# Patient Record
Sex: Male | Born: 1995 | Race: White | Hispanic: No | Marital: Single | Smoking: Current some day smoker
Health system: Southern US, Community
[De-identification: ages and names within clinical notes are randomized; demographics above are authoritative.]

## PROBLEM LIST (undated history)

## (undated) DIAGNOSIS — C801 Malignant (primary) neoplasm, unspecified: Secondary | ICD-10-CM

## (undated) HISTORY — PX: KNEE SURGERY: SHX244

## (undated) HISTORY — PX: MEDIPORT INSERTION, SINGLE: SHX2027

## (undated) HISTORY — PX: FEMUR TUMOR RESECTION: SHX944

## (undated) HISTORY — PX: MEDIPORT REMOVAL: SHX2028

---
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
## 2016-06-26 NOTE — Procedures (Signed)
 [facility] Radiology Procedure Note     Operators (Resident/Attending): Cydne Jodee Monte   Procedure: Fluoroscopic guided right hip injection   Indication: status post right hip arthroplasty with pain    Findings: Contrast outlining the right hip joint.    Estimated blood loss (if applicable): None   Specimens removed (if applicable): None   Postoperative diagnosis : Same   Cydne Aneita Holt, MD   Electronically signed by: Cydne Aneita Holt, MD Resident 06/26/16 [phone]

## 2016-07-22 NOTE — Progress Notes (Signed)
--  Fifteen minutes were spent during this established patient encounter.  The encounter diagnosis was Ewing's sarcoma of right femur (HCC).  --More than half of that time was spent in a face to face manner in the examination room, coordinating the patient's orthopedic care, and counseling and educating the patient. States injection very helpful in reducing R hip joint pain  --Together we reviewed the pertinent physical findings on today's exam: walks with less limp  --Based on this information I communicated my impression that his pain is from articular wear of the acetabulum  --Together we agreed to proceed with repeat shot as needed for now.  Will call to get script for new shot at [facility] as needed.  --f/u surveillance films in December   Electronically signed by: Glendia Ambrose Blush, MD 07/22/16 [phone]

## 2016-08-05 NOTE — Telephone Encounter (Signed)
 Would like to reschedule appointment on 08/26/16. He will be home from college from 07/3016/-08/15/16. Please advise.

## 2016-08-06 NOTE — Telephone Encounter (Signed)
 Called and spoke to mom. Echo appointment and ONC appointment moved to 08/15/16. She will check with patient and will call us  back if theres any other change. Agreed on having ECHO first, X-rays and then ONC. Mom okay with plan.

## 2016-09-20 ENCOUNTER — Ambulatory Visit (HOSPITAL_COMMUNITY)
Admission: RE | Admit: 2016-09-20 | Discharge: 2016-09-20 | Disposition: A | Discharge status: Home / Self Care | Payer: BLUE CROSS/BLUE SHIELD | Source: Ambulatory Visit | Attending: Pediatrics | Admitting: Pediatrics

## 2016-09-20 ENCOUNTER — Ambulatory Visit (HOSPITAL_COMMUNITY)
Admit: 2016-09-20 | Discharge: 2016-09-20 | Disposition: A | Discharge status: Home / Self Care | Payer: BLUE CROSS/BLUE SHIELD | Source: Other Acute Inpatient Hospital | Attending: Pediatrics | Admitting: Pediatrics

## 2016-09-20 ENCOUNTER — Other Ambulatory Visit (HOSPITAL_COMMUNITY): Payer: Self-pay | Admitting: Pediatrics

## 2016-09-20 DIAGNOSIS — N5089 Other specified disorders of the male genital organs: Secondary | ICD-10-CM | POA: Insufficient documentation

## 2016-09-20 DIAGNOSIS — N50811 Right testicular pain: Secondary | ICD-10-CM | POA: Insufficient documentation
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
## 2017-05-19 DIAGNOSIS — F419 Anxiety disorder, unspecified: Secondary | ICD-10-CM | POA: Insufficient documentation

## 2017-10-12 ENCOUNTER — Ambulatory Visit: Payer: BLUE CROSS/BLUE SHIELD | Admitting: Podiatry

## 2017-10-12 ENCOUNTER — Encounter: Payer: Self-pay | Admitting: Podiatry

## 2017-10-12 VITALS — BP 103/71 | HR 70

## 2017-10-12 DIAGNOSIS — M79671 Pain in right foot: Secondary | ICD-10-CM

## 2017-10-12 DIAGNOSIS — M79672 Pain in left foot: Secondary | ICD-10-CM

## 2017-10-12 DIAGNOSIS — B070 Plantar wart: Secondary | ICD-10-CM | POA: Diagnosis not present

## 2017-10-12 NOTE — Progress Notes (Signed)
Subjective:   Patient ID: Edwin Cook, male   DOB: 22 y.o.   MRN: [id number]   HPI Patient presents with mother with multiple lesions bottom of the right foot which is been present for several years.  They have tried over-the-counter medicine and trimming without relief and states that they are increasingly sore with walking.  Patient does not smoke and would like to be active and is currently in college and returning to college in mid August   Review of Systems  All other systems reviewed and are negative.       Objective:  Physical Exam  Constitutional: He appears well-developed and well-nourished.  Cardiovascular: Intact distal pulses.  Pulmonary/Chest: Effort normal.  Musculoskeletal: Normal range of motion.  Neurological: He is alert.  Skin: Skin is warm.  Nursing note and vitals reviewed.   Neurovascular status found to be intact muscle strength is adequate range of motion within normal limits.  Patient is noted to have multiple patches of keratotic lesion plantar aspect right first metatarsal and right plantar hallux.  Upon debridement there is pinpoint bleeding noted within all lesion in the painful to lateral pressure.  Patient is found to have good digital perfusion well oriented x3     Assessment:  Verruca plantaris plantar aspect right first metatarsal right hallux     Plan:  H&P conditions reviewed and recommended utilizing immune agent to try to kill the viral tissue.  Today I debrided all lesions I then applied substance to create an inflammatory and immune response and applied sterile dressings.  Gave instructions on what to do if any blistering were to occur and dispense padding if any pain or to occur.  Also placed on topical medicine with 5-fluorouracil and patient will be seen back in 3 weeks or earlier if needed

## 2017-10-22 DIAGNOSIS — F900 Attention-deficit hyperactivity disorder, predominantly inattentive type: Secondary | ICD-10-CM | POA: Insufficient documentation

## 2017-10-26 ENCOUNTER — Encounter: Payer: Self-pay | Admitting: Podiatry

## 2017-10-26 ENCOUNTER — Ambulatory Visit (INDEPENDENT_AMBULATORY_CARE_PROVIDER_SITE_OTHER): Payer: BLUE CROSS/BLUE SHIELD | Admitting: Podiatry

## 2017-10-26 DIAGNOSIS — B070 Plantar wart: Secondary | ICD-10-CM | POA: Diagnosis not present

## 2017-10-26 NOTE — Progress Notes (Signed)
Subjective:   Patient ID: Edwin Cook, male   DOB: 22 y.o.   MRN: [id number]   HPI Patient states the lesion is still present and well is improved and I did get small blistering it still is there   ROS      Objective:  Physical Exam  Neurovascular status intact with patient's right lesion upon debridement shows pinpoint bleeding with pain to lateral pressure     Assessment:  Verruca plantaris right foot plantar measuring approximately 1 cm x 1 cm     Plan:  Sterile debridement accomplished and applied chemical agent to create immune response with sterile dressing.  Reappoint as symptoms persist or indicate if there is any enlargement or other pathology

## 2018-05-24 ENCOUNTER — Ambulatory Visit (INDEPENDENT_AMBULATORY_CARE_PROVIDER_SITE_OTHER): Payer: BLUE CROSS/BLUE SHIELD | Admitting: Podiatry

## 2018-05-24 ENCOUNTER — Encounter: Payer: Self-pay | Admitting: Podiatry

## 2018-05-24 DIAGNOSIS — B070 Plantar wart: Secondary | ICD-10-CM | POA: Diagnosis not present

## 2018-05-24 NOTE — Progress Notes (Signed)
.  NRSTANDARDNOTE Subjective:   Patient ID: Edwin Cook, male   DOB: 23 y.o.   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  I do think orthotics would be best for him and I reviewed that with him and his mother and they are to schedule with ped orthotist for orthotic casting and measurement to decide the appropriate amount of lift of the right side.  Today I debrided the lesion on the right exposed the tissue and applied chemical consisting of immune agent and sterile dressings.  Reappoint for [place] to recheck

## 2018-06-21 ENCOUNTER — Ambulatory Visit: Payer: Self-pay | Admitting: Podiatry

## 2018-06-21 ENCOUNTER — Other Ambulatory Visit: Payer: Self-pay | Admitting: Orthotics

## 2018-06-30 ENCOUNTER — Other Ambulatory Visit: Payer: Self-pay

## 2018-06-30 ENCOUNTER — Encounter: Payer: Self-pay | Admitting: Podiatry

## 2018-06-30 ENCOUNTER — Ambulatory Visit: Payer: BLUE CROSS/BLUE SHIELD | Admitting: Podiatry

## 2018-06-30 ENCOUNTER — Other Ambulatory Visit: Payer: BLUE CROSS/BLUE SHIELD | Admitting: Orthotics

## 2018-06-30 VITALS — Temp 97.3°F

## 2018-06-30 DIAGNOSIS — B070 Plantar wart: Secondary | ICD-10-CM | POA: Diagnosis not present

## 2018-06-30 DIAGNOSIS — M7751 Other enthesopathy of right foot: Secondary | ICD-10-CM

## 2018-06-30 DIAGNOSIS — M779 Enthesopathy, unspecified: Secondary | ICD-10-CM

## 2018-06-30 DIAGNOSIS — M7752 Other enthesopathy of left foot: Secondary | ICD-10-CM | POA: Diagnosis not present

## 2018-07-01 NOTE — Progress Notes (Signed)
Subjective:   Patient ID: Edwin Cook, male   DOB: 23 y.o.   MRN: [id number]   HPI Patient presents stating he has lesions on the bottom of my right are improving but still present and I do have a significant limb length with chronic pain in my feet and I need orthotics   ROS      Objective:  Physical Exam  Neurovascular status intact with patient found to have multiple lesions plantar aspect right first metatarsal hallux that upon debridement she will still pinpoint bleeding but are thinner than previous with significant limb length discrepancy with right leg being approximate 1/2 inch shorter than left secondary to previous bone cancer     Assessment:  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  Instructed on what to do blistering were to occur and also went ahead today and casted for functional orthotic devices with heel lift right to support the plantar arch

## 2018-07-29 ENCOUNTER — Ambulatory Visit: Payer: BLUE CROSS/BLUE SHIELD | Admitting: Orthotics

## 2018-07-29 ENCOUNTER — Encounter: Payer: Self-pay | Admitting: Podiatry

## 2018-07-29 ENCOUNTER — Ambulatory Visit: Payer: BLUE CROSS/BLUE SHIELD | Admitting: Podiatry

## 2018-07-29 ENCOUNTER — Other Ambulatory Visit: Payer: Self-pay

## 2018-07-29 VITALS — Temp 98.4°F

## 2018-07-29 DIAGNOSIS — B070 Plantar wart: Secondary | ICD-10-CM

## 2018-07-29 DIAGNOSIS — M79671 Pain in right foot: Secondary | ICD-10-CM

## 2018-07-29 DIAGNOSIS — M779 Enthesopathy, unspecified: Secondary | ICD-10-CM

## 2018-07-29 NOTE — Progress Notes (Signed)
Subjective:   Patient ID: Edwin Cook, male   DOB: 23 y.o.   MRN: [id number]   HPI Patient presents stating the orthotics feel well and is here for chronic plantar wart right stating it is doing much better   ROS      Objective:  Physical Exam  Neurovascular status intact with multiple keratotic lesion sub-first metatarsal head right hallux that have improved with several of them showing pinpoint bleeding but significant reduction from previous     Assessment:  Verruca plantaris right improving     Plan:  Sterile sharp debridement accomplished and I applied agent to create immune response with sterile dressing and patient is discharged and will be seen back as needed

## 2018-07-29 NOTE — Patient Instructions (Signed)

## 2018-07-29 NOTE — Progress Notes (Signed)
Patient came in today to pick up custom made foot orthotics.  The goals were accomplished and the patient reported no dissatisfaction with said orthotics.  Patient was advised of breakin period and how to report any issues. 

## 2019-04-07 ENCOUNTER — Other Ambulatory Visit: Payer: Self-pay

## 2019-04-07 ENCOUNTER — Encounter (HOSPITAL_COMMUNITY): Payer: Self-pay | Admitting: Emergency Medicine

## 2019-04-07 ENCOUNTER — Emergency Department (HOSPITAL_COMMUNITY)
Admission: EM | Admit: 2019-04-07 | Discharge: 2019-04-07 | Disposition: A | Discharge status: Home / Self Care | Payer: BC Managed Care – PPO | Attending: Emergency Medicine | Admitting: Emergency Medicine

## 2019-04-07 ENCOUNTER — Emergency Department (HOSPITAL_COMMUNITY): Payer: BC Managed Care – PPO

## 2019-04-07 DIAGNOSIS — M545 Low back pain: Secondary | ICD-10-CM | POA: Insufficient documentation

## 2019-04-07 DIAGNOSIS — Z79899 Other long term (current) drug therapy: Secondary | ICD-10-CM | POA: Diagnosis not present

## 2019-04-07 DIAGNOSIS — Z96641 Presence of right artificial hip joint: Secondary | ICD-10-CM | POA: Diagnosis not present

## 2019-04-07 DIAGNOSIS — R0789 Other chest pain: Secondary | ICD-10-CM | POA: Diagnosis present

## 2019-04-07 DIAGNOSIS — Z85830 Personal history of malignant neoplasm of bone: Secondary | ICD-10-CM | POA: Diagnosis not present

## 2019-04-07 LAB — CBC
HCT: 40.5 % (ref 39.0–52.0)
Hemoglobin: 13.5 g/dL (ref 13.0–17.0)
MCH: 30.5 pg (ref 26.0–34.0)
MCHC: 33.3 g/dL (ref 30.0–36.0)
MCV: 91.4 fL (ref 80.0–100.0)
Platelets: 364 10*3/uL (ref 150–400)
RBC: 4.43 MIL/uL (ref 4.22–5.81)
RDW: 12.2 % (ref 11.5–15.5)
WBC: 13.4 10*3/uL — ABNORMAL HIGH (ref 4.0–10.5)
nRBC: 0 % (ref 0.0–0.2)

## 2019-04-07 LAB — TROPONIN I (HIGH SENSITIVITY)
Troponin I (High Sensitivity): 3 ng/L (ref <18)
Troponin I (High Sensitivity): <2 ng/L (ref <18)

## 2019-04-07 LAB — PROTIME-INR
INR: 1.1 (ref 0.8–1.2)
Prothrombin Time: 13.8 seconds (ref 11.4–15.2)

## 2019-04-07 LAB — BASIC METABOLIC PANEL
Anion gap: 10 (ref 5–15)
BUN: 9 mg/dL (ref 6–20)
CO2: 27 mmol/L (ref 22–32)
Calcium: 9.1 mg/dL (ref 8.9–10.3)
Chloride: 101 mmol/L (ref 98–111)
Creatinine, Ser: 0.85 mg/dL (ref 0.61–1.24)
GFR calc Af Amer: >60 mL/min (ref >60)
GFR calc non Af Amer: >60 mL/min (ref >60)
Glucose, Bld: 102 mg/dL — ABNORMAL HIGH (ref 70–99)
Potassium: 3.6 mmol/L (ref 3.5–5.1)
Sodium: 138 mmol/L (ref 135–145)

## 2019-04-07 LAB — D-DIMER, QUANTITATIVE (NOT AT ARMC): D-Dimer, Quant: 0.31 ug/mL-FEU (ref 0.00–0.50)

## 2019-04-07 MED ORDER — IBUPROFEN 800 MG PO TABS: 800.0000 mg | ORAL_TABLET | Freq: Four times a day (QID) | ORAL | 0 refills | Status: DC | PRN

## 2019-04-07 MED ORDER — SODIUM CHLORIDE 0.9% FLUSH: 3.0000 mL | Freq: Once | INTRAVENOUS | Status: DC

## 2019-04-07 MED ORDER — HYDROCODONE-ACETAMINOPHEN 5-325 MG PO TABS
1.0000 | ORAL_TABLET | Freq: Once | ORAL | Status: AC
  Administered 2019-04-07: 06:00:00 1 via ORAL
  Filled 2019-04-07: qty 1

## 2019-04-07 MED ORDER — IBUPROFEN 800 MG PO TABS
800.0000 mg | ORAL_TABLET | Freq: Once | ORAL | Status: AC
  Administered 2019-04-07: 800 mg via ORAL
  Filled 2019-04-07: qty 1

## 2019-04-07 NOTE — ED Provider Notes (Addendum)
[facility] EMERGENCY DEPARTMENT Provider Note   CSN: [id number] Arrival date & time: 04/07/19  0117     History Chief Complaint  Patient presents with  . Chest Pain    Edwin Cook is a 24 y.o. male.  Patient presents to the emergency department for evaluation of chest pain.  Patient reports onset of a sharp pain to the left of his sternum earlier today.  Pain radiates into the back and towards the left shoulder.  He reports that he feels like his pain worsens when he takes a deep breath.  He has not had any cough, congestion or flulike symptoms.  Patient denies any previous lung disease.  No history of blood clots, no recent surgery, travel.  He denies leg pain and swelling.  Patient does report that 2 or 3 days ago he bent over while sitting in a chair and had sudden onset of pain in the lower back.  At that time the pain worsens when he took breaths as well.  Pain in the lower back has resolved.        Past Medical History:  Diagnosis Date  . 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 ADHD (attention deficit hyperactivity disorder), inattentive type 10/22/2017  . Anxiety 05/19/2017  . 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 Metatarsal stress fracture of right foot with delayed healing 05/09/2014  . Major depressive disorder with single episode, in partial remission ([place]) 07/16/2013  . Pain, joint, knee, left 05/11/2012  . Hip pain, right 01/27/2012  . Major depressive disorder 01/13/2012  . Obesity 07/25/2011  . Leg length difference, acquired 07/17/2011  . Fatigue 02/11/2011  . Intermittent fever of unknown origin 02/11/2011  . Status post right hip replacement 01/07/2011    Past Surgical History:  Procedure Laterality Date  . FEMUR TUMOR RESECTION    . KNEE SURGERY    . KNEE SURGERY    . MEDIPORT INSERTION, SINGLE    . MEDIPORT REMOVAL         Family History  Problem Relation Age of Onset    . Diabetes Mother   . Hypertension Mother   . Thyroid disease Mother   . Asthma Father     Social History   Tobacco Use  . Smoking status: Never Smoker  . Smokeless tobacco: Never Used  Substance Use Topics  . Alcohol use: No    Alcohol/week: 0.0 standard drinks  . Drug use: No    Home Medications Prior to Admission medications   Medication Sig Start Date End Date Taking? Authorizing Provider  buPROPion (WELLBUTRIN XL) 300 MG 24 hr tablet Take 300 mg by mouth daily. 09/05/17   [provider]  busPIRone (BUSPAR) 10 MG tablet Take by mouth. 05/24/18   [provider]  ibuprofen (ADVIL) 800 MG tablet Take 1 tablet (800 mg total) by mouth every 6 (six) hours as needed for moderate pain. 04/07/19   Orpah Greek, MD  methylphenidate (RITALIN) 10 MG tablet Take 10 mg by mouth 2 (two) times daily. 07/22/17   [provider]  NON FORMULARY [facility] Wart Cream Cimetidine 2%; Deoxy-D-Glucose 0.2%; Fluorouracil 5%; Salicylic Acid 0000000 Faxed over on 05/24/18 with refills as needed    [provider]  propranolol (INDERAL) 10 MG tablet Take by mouth. 05/24/18   [provider]    Allergies    Morphine and related  Review of Systems  Review of Systems  Cardiovascular: Positive for chest pain.  Musculoskeletal: Positive for back pain.  All other systems reviewed and are negative.   Physical Exam Updated Vital Signs BP 116/65 (BP Location: Left Arm)   Pulse 85   Temp 98.1 F (36.7 C) (Oral)   Resp (!) 23   SpO2 97%   Physical Exam Vitals and nursing note reviewed.  Constitutional:      General: He is not in acute distress.    Appearance: Normal appearance. He is well-developed.  HENT:     Head: Normocephalic and atraumatic.     Right Ear: Hearing normal.     Left Ear: Hearing normal.     Nose: Nose normal.  Eyes:     Conjunctiva/sclera: Conjunctivae normal.     Pupils: Pupils are equal, round, and reactive to  light.  Cardiovascular:     Rate and Rhythm: Regular rhythm. Tachycardia present.     Heart sounds: S1 normal and S2 normal. No murmur. No friction rub. No gallop.   Pulmonary:     Effort: Pulmonary effort is normal. No respiratory distress.     Breath sounds: Normal breath sounds.  Chest:     Chest wall: No tenderness.  Abdominal:     General: Bowel sounds are normal.     Palpations: Abdomen is soft.     Tenderness: There is no abdominal tenderness. There is no guarding or rebound. Negative signs include Murphy's sign and McBurney's sign.     Hernia: No hernia is present.  Musculoskeletal:        General: Normal range of motion.     Cervical back: Normal range of motion and neck supple.  Skin:    General: Skin is warm and dry.     Findings: No rash.  Neurological:     Mental Status: He is alert and oriented to person, place, and time.     GCS: GCS eye subscore is 4. GCS verbal subscore is 5. GCS motor subscore is 6.     Cranial Nerves: No cranial nerve deficit.     Sensory: No sensory deficit.     Coordination: Coordination normal.  Psychiatric:        Speech: Speech normal.        Behavior: Behavior normal.        Thought Content: Thought content normal.     ED Results / Procedures / Treatments   Labs (all labs ordered are listed, but only abnormal results are displayed) Labs Reviewed  BASIC METABOLIC PANEL - Abnormal; Notable for the following components:      Result Value   Glucose, Bld 102 (*)    All other components within normal limits  CBC - Abnormal; Notable for the following components:   WBC 13.4 (*)    All other components within normal limits  PROTIME-INR  D-DIMER, QUANTITATIVE (NOT AT [facility])  TROPONIN I (HIGH SENSITIVITY)  TROPONIN I (HIGH SENSITIVITY)    EKG EKG Interpretation  Date/Time:  Thursday April 07 2019 01:21:39 EST Ventricular Rate:  109 PR Interval:  136 QRS Duration: 88 QT Interval:  306 QTC Calculation: 412 R Axis:   77 Text  Interpretation: Sinus tachycardia Nonspecific T wave abnormality Abnormal ECG No STEMI Confirmed by Nanda Quinton [id number]) on 04/21/2019 7:37:18 PM   Radiology No results found.  Procedures Procedures (including critical care time)  Medications Ordered in ED Medications  HYDROcodone-acetaminophen (NORCO/VICODIN) 5-325 MG per tablet 1 tablet (1 tablet Oral Given 04/07/19 0601)  ibuprofen (  ADVIL) tablet 800 mg (800 mg Oral Given 04/07/19 [id number])    ED Course  I have reviewed the triage vital signs and the nursing notes.  Pertinent labs & imaging results that were available during my care of the patient were reviewed by me and considered in my medical decision making (see chart for details).    MDM Rules/Calculators/A&P                      Patient presents to the emergency department for evaluation of chest pain.  Patient complaining of pain in the left chest, just next to the sternum that radiates into his back into the left shoulder.  Left shoulder pain worsens with movement of the shoulder but I cannot reproduce the chest pain with palpation.  He does notice that it seems to worsen when he takes a deep breath.  It worsens when he lays down, improves if he sits up.  Cardiac evaluation unremarkable.  Troponins negative x2.  EKG unremarkable, no sign of pericarditis.  He did have mild tachycardia, PE considered part of the differential but he does not have any risk factors.  D-dimer normal.  Tachycardia felt to be secondary to pain.  Treated with analgesia. Final Clinical Impression(s) / ED Diagnoses Final diagnoses:  Chest wall pain    Rx / DC Orders ED Discharge Orders         Ordered    ibuprofen (ADVIL) 800 MG tablet  Every 6 hours PRN     04/07/19 0625           Orpah Greek, MD 04/07/19 [id number]    Orpah Greek, MD 04/28/19 [phone]

## 2019-04-07 NOTE — ED Triage Notes (Signed)
Patient reports mid sternal chest pain with SOB onset this evening pain radiating to left shoulder and left lower back , pain increases with deep inspiration , denies cough or fever , no emesis or diaphoresis .

## 2019-08-12 ENCOUNTER — Other Ambulatory Visit: Payer: Self-pay

## 2019-08-12 ENCOUNTER — Ambulatory Visit (INDEPENDENT_AMBULATORY_CARE_PROVIDER_SITE_OTHER): Payer: BC Managed Care – PPO

## 2019-08-12 ENCOUNTER — Encounter (HOSPITAL_COMMUNITY): Payer: Self-pay

## 2019-08-12 ENCOUNTER — Emergency Department (HOSPITAL_COMMUNITY): Payer: BC Managed Care – PPO

## 2019-08-12 ENCOUNTER — Inpatient Hospital Stay (HOSPITAL_COMMUNITY): Payer: BC Managed Care – PPO

## 2019-08-12 ENCOUNTER — Inpatient Hospital Stay (HOSPITAL_COMMUNITY)
Admission: EM | Admit: 2019-08-12 | Discharge: 2019-08-13 | DRG: 871 | Disposition: A | Discharge status: Other Acute Inpatient Hospital | Payer: BC Managed Care – PPO | Attending: Internal Medicine | Admitting: Internal Medicine

## 2019-08-12 ENCOUNTER — Ambulatory Visit (INDEPENDENT_AMBULATORY_CARE_PROVIDER_SITE_OTHER)
Admission: EM | Admit: 2019-08-12 | Discharge: 2019-08-12 | Disposition: A | Discharge status: Other Acute Inpatient Hospital | Payer: BC Managed Care – PPO | Source: Home / Self Care

## 2019-08-12 ENCOUNTER — Encounter (HOSPITAL_COMMUNITY): Payer: Self-pay | Admitting: Emergency Medicine

## 2019-08-12 DIAGNOSIS — Z20822 Contact with and (suspected) exposure to covid-19: Secondary | ICD-10-CM | POA: Diagnosis present

## 2019-08-12 DIAGNOSIS — Z9889 Other specified postprocedural states: Secondary | ICD-10-CM

## 2019-08-12 DIAGNOSIS — A419 Sepsis, unspecified organism: Secondary | ICD-10-CM | POA: Diagnosis present

## 2019-08-12 DIAGNOSIS — J9601 Acute respiratory failure with hypoxia: Secondary | ICD-10-CM | POA: Diagnosis present

## 2019-08-12 DIAGNOSIS — F17290 Nicotine dependence, other tobacco product, uncomplicated: Secondary | ICD-10-CM | POA: Diagnosis present

## 2019-08-12 DIAGNOSIS — R0602 Shortness of breath: Secondary | ICD-10-CM

## 2019-08-12 DIAGNOSIS — Z885 Allergy status to narcotic agent status: Secondary | ICD-10-CM

## 2019-08-12 DIAGNOSIS — F909 Attention-deficit hyperactivity disorder, unspecified type: Secondary | ICD-10-CM | POA: Diagnosis present

## 2019-08-12 DIAGNOSIS — J90 Pleural effusion, not elsewhere classified: Secondary | ICD-10-CM | POA: Diagnosis present

## 2019-08-12 DIAGNOSIS — E669 Obesity, unspecified: Secondary | ICD-10-CM | POA: Diagnosis present

## 2019-08-12 DIAGNOSIS — C419 Malignant neoplasm of bone and articular cartilage, unspecified: Secondary | ICD-10-CM | POA: Diagnosis present

## 2019-08-12 DIAGNOSIS — J189 Pneumonia, unspecified organism: Secondary | ICD-10-CM | POA: Diagnosis present

## 2019-08-12 DIAGNOSIS — R079 Chest pain, unspecified: Secondary | ICD-10-CM | POA: Diagnosis not present

## 2019-08-12 DIAGNOSIS — Z79899 Other long term (current) drug therapy: Secondary | ICD-10-CM | POA: Diagnosis not present

## 2019-08-12 DIAGNOSIS — Z538 Procedure and treatment not carried out for other reasons: Secondary | ICD-10-CM | POA: Diagnosis present

## 2019-08-12 DIAGNOSIS — Z6833 Body mass index (BMI) 33.0-33.9, adult: Secondary | ICD-10-CM

## 2019-08-12 DIAGNOSIS — Z96641 Presence of right artificial hip joint: Secondary | ICD-10-CM | POA: Diagnosis present

## 2019-08-12 DIAGNOSIS — Z9221 Personal history of antineoplastic chemotherapy: Secondary | ICD-10-CM

## 2019-08-12 DIAGNOSIS — J869 Pyothorax without fistula: Secondary | ICD-10-CM | POA: Diagnosis present

## 2019-08-12 LAB — HIV ANTIBODY (ROUTINE TESTING W REFLEX): HIV Screen 4th Generation wRfx: NONREACTIVE

## 2019-08-12 LAB — CBC WITH DIFFERENTIAL/PLATELET
Abs Immature Granulocytes: 0.09 10*3/uL — ABNORMAL HIGH (ref 0.00–0.07)
Basophils Absolute: 0.1 10*3/uL (ref 0.0–0.1)
Basophils Relative: 0 %
Eosinophils Absolute: 0 10*3/uL (ref 0.0–0.5)
Eosinophils Relative: 0 %
HCT: 39.2 % (ref 39.0–52.0)
Hemoglobin: 13.2 g/dL (ref 13.0–17.0)
Immature Granulocytes: 1 %
Lymphocytes Relative: 10 %
Lymphs Abs: 1.6 10*3/uL (ref 0.7–4.0)
MCH: 30.3 pg (ref 26.0–34.0)
MCHC: 33.7 g/dL (ref 30.0–36.0)
MCV: 90.1 fL (ref 80.0–100.0)
Monocytes Absolute: 1.8 10*3/uL — ABNORMAL HIGH (ref 0.1–1.0)
Monocytes Relative: 11 %
Neutro Abs: 12 10*3/uL — ABNORMAL HIGH (ref 1.7–7.7)
Neutrophils Relative %: 78 %
Platelets: 284 10*3/uL (ref 150–400)
RBC: 4.35 MIL/uL (ref 4.22–5.81)
RDW: 12.3 % (ref 11.5–15.5)
WBC: 15.5 10*3/uL — ABNORMAL HIGH (ref 4.0–10.5)
nRBC: 0 % (ref 0.0–0.2)

## 2019-08-12 LAB — COMPREHENSIVE METABOLIC PANEL
ALT: 18 U/L (ref 0–44)
AST: 14 U/L — ABNORMAL LOW (ref 15–41)
Albumin: 3.6 g/dL (ref 3.5–5.0)
Alkaline Phosphatase: 85 U/L (ref 38–126)
Anion gap: 12 (ref 5–15)
BUN: 10 mg/dL (ref 6–20)
CO2: 20 mmol/L — ABNORMAL LOW (ref 22–32)
Calcium: 8.9 mg/dL (ref 8.9–10.3)
Chloride: 102 mmol/L (ref 98–111)
Creatinine, Ser: 0.74 mg/dL (ref 0.61–1.24)
GFR calc Af Amer: >60 mL/min (ref >60)
GFR calc non Af Amer: >60 mL/min (ref >60)
Glucose, Bld: 93 mg/dL (ref 70–99)
Potassium: 3.9 mmol/L (ref 3.5–5.1)
Sodium: 134 mmol/L — ABNORMAL LOW (ref 135–145)
Total Bilirubin: 1.1 mg/dL (ref 0.3–1.2)
Total Protein: 7.4 g/dL (ref 6.5–8.1)

## 2019-08-12 LAB — URINALYSIS, ROUTINE W REFLEX MICROSCOPIC
Bacteria, UA: NONE SEEN
Bilirubin Urine: NEGATIVE
Glucose, UA: NEGATIVE mg/dL
Ketones, ur: NEGATIVE mg/dL
Leukocytes,Ua: NEGATIVE
Nitrite: NEGATIVE
Protein, ur: 30 mg/dL — AB
Specific Gravity, Urine: 1.021 (ref 1.005–1.030)
pH: 5 (ref 5.0–8.0)

## 2019-08-12 LAB — RAPID URINE DRUG SCREEN, HOSP PERFORMED
Amphetamines: NOT DETECTED
Barbiturates: NOT DETECTED
Benzodiazepines: NOT DETECTED
Cocaine: NOT DETECTED
Opiates: NOT DETECTED
Tetrahydrocannabinol: NOT DETECTED

## 2019-08-12 LAB — LACTIC ACID, PLASMA: Lactic Acid, Venous: 1 mmol/L (ref 0.5–1.9)

## 2019-08-12 LAB — C-REACTIVE PROTEIN: CRP: 23.4 mg/dL — ABNORMAL HIGH (ref <1.0)

## 2019-08-12 LAB — PROCALCITONIN: Procalcitonin: 1.37 ng/mL

## 2019-08-12 LAB — SEDIMENTATION RATE: Sed Rate: 68 mm/hr — ABNORMAL HIGH (ref 0–16)

## 2019-08-12 LAB — LACTATE DEHYDROGENASE: LDH: 171 U/L (ref 98–192)

## 2019-08-12 LAB — SARS CORONAVIRUS 2 BY RT PCR (HOSPITAL ORDER, PERFORMED IN ~~LOC~~ HOSPITAL LAB): SARS Coronavirus 2: NEGATIVE

## 2019-08-12 LAB — PROTIME-INR
INR: 1.2 (ref 0.8–1.2)
Prothrombin Time: 15 seconds (ref 11.4–15.2)

## 2019-08-12 LAB — APTT: aPTT: 35 seconds (ref 24–36)

## 2019-08-12 MED ORDER — ACETAMINOPHEN 650 MG RE SUPP: 650.0000 mg | Freq: Four times a day (QID) | RECTAL | Status: DC | PRN

## 2019-08-12 MED ORDER — SODIUM CHLORIDE 0.9 % IV SOLN
2.0000 g | INTRAVENOUS | Status: DC
  Administered 2019-08-12 – 2019-08-13 (×2): 2 g via INTRAVENOUS
  Filled 2019-08-12: qty 20
  Filled 2019-08-12: qty 2

## 2019-08-12 MED ORDER — HYDROCODONE-ACETAMINOPHEN 5-325 MG PO TABS
1.0000 | ORAL_TABLET | ORAL | Status: DC | PRN
  Administered 2019-08-12: 2 via ORAL
  Administered 2019-08-13: 1 via ORAL
  Administered 2019-08-13: 2 via ORAL
  Filled 2019-08-12: qty 1
  Filled 2019-08-12 (×2): qty 2

## 2019-08-12 MED ORDER — ENOXAPARIN SODIUM 40 MG/0.4ML ~~LOC~~ SOLN
40.0000 mg | SUBCUTANEOUS | Status: DC
  Filled 2019-08-12: qty 0.4

## 2019-08-12 MED ORDER — SODIUM CHLORIDE 0.9 % IV BOLUS (SEPSIS): 500.0000 mL | Freq: Once | INTRAVENOUS | Status: DC

## 2019-08-12 MED ORDER — SODIUM CHLORIDE 0.9% FLUSH
3.0000 mL | Freq: Once | INTRAVENOUS | Status: AC
  Administered 2019-08-12: 3 mL via INTRAVENOUS

## 2019-08-12 MED ORDER — SODIUM CHLORIDE 0.9 % IV SOLN
500.0000 mg | INTRAVENOUS | Status: DC
  Administered 2019-08-12: 500 mg via INTRAVENOUS
  Filled 2019-08-12 (×2): qty 500

## 2019-08-12 MED ORDER — ONDANSETRON HCL 4 MG PO TABS: 4.0000 mg | ORAL_TABLET | Freq: Four times a day (QID) | ORAL | Status: DC | PRN

## 2019-08-12 MED ORDER — ACETAMINOPHEN 325 MG PO TABS: 650.0000 mg | ORAL_TABLET | Freq: Four times a day (QID) | ORAL | Status: DC | PRN

## 2019-08-12 MED ORDER — LACTATED RINGERS IV SOLN: INTRAVENOUS | Status: DC

## 2019-08-12 MED ORDER — SODIUM CHLORIDE 0.9% FLUSH
3.0000 mL | Freq: Two times a day (BID) | INTRAVENOUS | Status: DC
  Administered 2019-08-12: 3 mL via INTRAVENOUS

## 2019-08-12 MED ORDER — LIDOCAINE HCL (PF) 1 % IJ SOLN
INTRAMUSCULAR | Status: AC
  Filled 2019-08-12: qty 30

## 2019-08-12 MED ORDER — HYDRALAZINE HCL 20 MG/ML IJ SOLN: 5.0000 mg | INTRAMUSCULAR | Status: DC | PRN

## 2019-08-12 MED ORDER — ONDANSETRON HCL 4 MG/2ML IJ SOLN
4.0000 mg | Freq: Four times a day (QID) | INTRAMUSCULAR | Status: DC | PRN
  Administered 2019-08-13: 4 mg via INTRAVENOUS
  Filled 2019-08-12: qty 2

## 2019-08-12 MED ORDER — SODIUM CHLORIDE 0.9 % IV BOLUS (SEPSIS): 1000.0000 mL | Freq: Once | INTRAVENOUS | Status: DC

## 2019-08-12 MED ORDER — IOHEXOL 300 MG/ML  SOLN
75.0000 mL | Freq: Once | INTRAMUSCULAR | Status: AC | PRN
  Administered 2019-08-12: 75 mL via INTRAVENOUS

## 2019-08-12 MED ORDER — POLYETHYLENE GLYCOL 3350 17 G PO PACK: 17.0000 g | PACK | Freq: Every day | ORAL | Status: DC | PRN

## 2019-08-12 MED ORDER — FENTANYL CITRATE (PF) 100 MCG/2ML IJ SOLN
100.0000 ug | Freq: Once | INTRAMUSCULAR | Status: AC
  Administered 2019-08-12: 100 ug via INTRAVENOUS
  Filled 2019-08-12: qty 2

## 2019-08-12 MED ORDER — ACETAMINOPHEN 325 MG PO TABS
650.0000 mg | ORAL_TABLET | Freq: Once | ORAL | Status: AC | PRN
  Administered 2019-08-12: 650 mg via ORAL
  Filled 2019-08-12: qty 2

## 2019-08-12 MED ORDER — SODIUM CHLORIDE 0.9 % IV SOLN
1000.0000 mL | INTRAVENOUS | Status: DC
  Administered 2019-08-12 – 2019-08-13 (×3): 1000 mL via INTRAVENOUS

## 2019-08-12 MED ORDER — DOCUSATE SODIUM 100 MG PO CAPS
100.0000 mg | ORAL_CAPSULE | Freq: Two times a day (BID) | ORAL | Status: DC
  Filled 2019-08-12: qty 1

## 2019-08-12 NOTE — ED Provider Notes (Signed)
[facility] EMERGENCY DEPARTMENT Provider Note   CSN: [id number] Arrival date & time: 08/12/19  1121     History Chief Complaint  Patient presents with  . Pneumonia  . Chest Pain    Edwin Cook is a 24 y.o. male.  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  He has never had any other significant medical history.  He presents with a feeling of shortness of breath and left-sided chest pleuritic pain.  He states this started within the last several days but has worsened and has now developed a feeling of some sputum, the pain is worse with deep breathing, he is short of breath when he lays down.  He is also short of breath on exertion.  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.  He reports that he had been able to do his job up until several days ago but over the last couple of days has been increasingly dyspneic.  Denies swelling in the legs, endorses having a fever, he did go to the urgent care today where an x-ray was done showing a large pleural effusion and likely infiltrate in the left side of his chest.  He denies urinary symptoms, denies nausea vomiting or diarrhea, no antibiotics given prior to arrival.  Past Medical History:  Diagnosis Date  . 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 ADHD (attention deficit hyperactivity disorder), inattentive type 10/22/2017  . Anxiety 05/19/2017  . 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 Metatarsal stress fracture of right foot with delayed healing 05/09/2014  . Major depressive disorder with single episode, in partial remission ([place]) 07/16/2013  . Pain, joint, knee, left 05/11/2012  . Hip pain, right 01/27/2012  . Major depressive disorder 01/13/2012  . Obesity 07/25/2011  . Leg  length difference, acquired 07/17/2011  . Fatigue 02/11/2011  . Intermittent fever of unknown origin 02/11/2011  . Status post right hip replacement 01/07/2011    Past Surgical History:  Procedure Laterality Date  . FEMUR TUMOR RESECTION    . KNEE SURGERY    . KNEE SURGERY    . MEDIPORT INSERTION, SINGLE    . MEDIPORT REMOVAL         Family History  Problem Relation Age of Onset  . Diabetes Mother   . Hypertension Mother   . Thyroid disease Mother   . Asthma Father     Social History   Tobacco Use  . Smoking status: Current Some Day Smoker    Types: E-cigarettes  . Smokeless tobacco: Never Used  Substance Use Topics  . Alcohol use: No    Alcohol/week: 0.0 standard drinks  . Drug use: No    Home Medications Prior to Admission medications   Medication Sig Start Date End Date Taking? Authorizing Provider  buPROPion (WELLBUTRIN XL) 300 MG 24 hr tablet Take 300 mg by mouth daily. 09/05/17   [provider]  busPIRone (BUSPAR) 10 MG tablet Take by mouth. 05/24/18   [provider]  ibuprofen (ADVIL) 800 MG tablet Take 1 tablet (800 mg total) by mouth every 6 (six) hours as needed for moderate pain. 04/07/19   Orpah Greek, MD  methylphenidate (RITALIN) 10 MG tablet Take 10  mg by mouth 2 (two) times daily. 07/22/17   [provider]  NON FORMULARY [facility] Wart Cream Cimetidine 2%; Deoxy-D-Glucose 0.2%; Fluorouracil 5%; Salicylic Acid 0000000 Faxed over on 05/24/18 with refills as needed    [provider]  propranolol (INDERAL) 10 MG tablet Take by mouth. 05/24/18   [provider]    Allergies    Morphine and related  Review of Systems   Review of Systems  All other systems reviewed and are negative.   Physical Exam Updated Vital Signs BP 131/74 (BP Location: Right Arm)   Pulse (!) 115   Temp (!) 102.5 F (39.2 C) (Oral)   Resp (!) 22   Ht 1.778 m (5\' 10" )   Wt 106.6 kg   SpO2 96%   BMI 33.72 kg/m    Physical Exam Vitals and nursing note reviewed.  Constitutional:      General: He is in acute distress.     Appearance: He is well-developed.  HENT:     Head: Normocephalic and atraumatic.     Mouth/Throat:     Pharynx: No oropharyngeal exudate.  Eyes:     General: No scleral icterus.       Right eye: No discharge.        Left eye: No discharge.     Conjunctiva/sclera: Conjunctivae normal.     Pupils: Pupils are equal, round, and reactive to light.  Neck:     Thyroid: No thyromegaly.     Vascular: No JVD.  Cardiovascular:     Rate and Rhythm: Regular rhythm. Tachycardia present.     Heart sounds: Normal heart sounds. No murmur. No friction rub. No gallop.   Pulmonary:     Effort: Tachypnea and respiratory distress present.     Breath sounds: Rales present. No wheezing.     Comments: Breath sounds are decreased in the left hemithorax, there is dullness to percussion of the left hemithorax, the patient is mildly tachypneic Abdominal:     General: Bowel sounds are normal. There is no distension.     Palpations: Abdomen is soft. There is no mass.     Tenderness: There is no abdominal tenderness.  Musculoskeletal:        General: No tenderness. Normal range of motion.     Cervical back: Normal range of motion and neck supple.     Right lower leg: No edema.     Left lower leg: No edema.  Lymphadenopathy:     Cervical: No cervical adenopathy.  Skin:    General: Skin is warm and dry.     Findings: No erythema or rash.  Neurological:     General: No focal deficit present.     Mental Status: He is alert.     Coordination: Coordination normal.  Psychiatric:        Behavior: Behavior normal.     ED Results / Procedures / Treatments   Labs (all labs ordered are listed, but only abnormal results are displayed) Labs Reviewed  CULTURE, BLOOD (ROUTINE X 2)  CULTURE, BLOOD (ROUTINE X 2)  URINE CULTURE  LACTIC ACID, PLASMA  LACTIC ACID, PLASMA  COMPREHENSIVE METABOLIC PANEL   CBC WITH DIFFERENTIAL/PLATELET  URINALYSIS, ROUTINE W REFLEX MICROSCOPIC  APTT  PROTIME-INR    EKG None  Radiology DG Chest 2 View  Result Date: 08/12/2019 CLINICAL DATA:  Left-sided chest pain. EXAM: CHEST - 2 VIEW COMPARISON:  04/07/2019. FINDINGS: Mediastinum hilar structures normal. Heart size normal. Left lung infiltrate with large  left pleural effusion. No pneumothorax. Degenerative changes scoliosis thoracic spine. IMPRESSION: Left lung infiltrate with large left pleural effusion. No pneumothorax. Electronically Signed   By: Marcello Moores  Register   On: 08/12/2019 11:02   CT Chest W Contrast  Result Date: 08/12/2019 CLINICAL DATA:  Pneumonia. EXAM: CT CHEST WITH CONTRAST TECHNIQUE: Multidetector CT imaging of the chest was performed during intravenous contrast administration. CONTRAST:  22mL OMNIPAQUE IOHEXOL 300 MG/ML  SOLN COMPARISON:  Chest x-ray 08/12/2019 FINDINGS: Cardiovascular: The heart is normal in size. No pericardial effusion. The aorta is normal in caliber. No dissection. No atherosclerotic calcifications. The branch vessels are normal. No coronary artery calcifications. Mediastinum/Nodes: Small scattered mediastinal and hilar lymph nodes, likely reactive. The esophagus is grossly normal. Lungs/Pleura: There is a large complex loculated left pleural fluid collection with significant compressive atelectasis of the left lung. Some aerated left upper lobe and a very small amount of aerated lingula. The left lower lobe is near completely atelectatic. I do not see any worrisome pleural enhancement or enhancing pleural lesions but empyema would certainly be a consideration. Recommend thoracentesis. The right lung is clear. No right-sided pleural effusion. The central tracheobronchial tree is unremarkable. No obvious obstructing endobronchial lesion. Upper Abdomen: No significant upper abdominal findings. No hepatic or adrenal gland lesions. No upper abdominal adenopathy. Musculoskeletal:  The thyroid gland is unremarkable. No supraclavicular or axillary adenopathy. Small scattered lymph nodes are noted. The bony thorax is intact.  No bone lesions. IMPRESSION: 1. Large complex loculated left pleural fluid collection with significant compressive atelectasis of the left lung. I do not see any worrisome pleural enhancement or enhancing pleural lesions but empyema would certainly be a consideration. Recommend thoracentesis. 2. Normal right lung.  No right pleural effusion. 3. No significant upper abdominal findings. Electronically Signed   By: Marijo Sanes M.D.   On: 08/12/2019 14:53   DG Chest Port 1 View  Result Date: 08/12/2019 CLINICAL DATA:  Post thoracentesis EXAM: PORTABLE CHEST 1 VIEW COMPARISON:  Chest x-ray 08/12/2019, CT chest 08/12/2019 FINDINGS: Right lung is grossly clear. Large loculated left pleural effusion, appears increased radiographically. No pneumothorax. The cardiomediastinal silhouette is obscured. IMPRESSION: Large loculated left pleural effusion appears radiographically increased compared with examination earlier today. There is no pneumothorax. Electronically Signed   By: Donavan Foil M.D.   On: 08/12/2019 19:24    Procedures .Critical Care Performed by: Noemi Chapel, MD Authorized by: Noemi Chapel, MD   Critical care provider statement:    Critical care time (minutes):  35   Critical care time was exclusive of:  Separately billable procedures and treating other patients and teaching time   Critical care was necessary to treat or prevent imminent or life-threatening deterioration of the following conditions:  Sepsis   Critical care was time spent personally by me on the following activities:  Blood draw for specimens, development of treatment plan with patient or surrogate, discussions with consultants, evaluation of patient's response to treatment, examination of patient, obtaining history from patient or surrogate, ordering and performing treatments and  interventions, ordering and review of laboratory studies, ordering and review of radiographic studies, pulse oximetry, re-evaluation of patient's condition and review of old charts   (including critical care time)  Medications Ordered in ED Medications  sodium chloride flush (NS) 0.9 % injection 3 mL (has no administration in time range)  0.9 %  sodium chloride infusion (has no administration in time range)  sodium chloride 0.9 % bolus 1,000 mL (has no administration in time range)  And  sodium chloride 0.9 % bolus 1,000 mL (has no administration in time range)    And  sodium chloride 0.9 % bolus 1,000 mL (has no administration in time range)    And  sodium chloride 0.9 % bolus 500 mL (has no administration in time range)  cefTRIAXone (ROCEPHIN) 2 g in sodium chloride 0.9 % 100 mL IVPB (has no administration in time range)  azithromycin (ZITHROMAX) 500 mg in sodium chloride 0.9 % 250 mL IVPB (has no administration in time range)  acetaminophen (TYLENOL) tablet 650 mg (650 mg Oral Given 08/12/19 1134)    ED Course  I have reviewed the triage vital signs and the nursing notes.  Pertinent labs & imaging results that were available during my care of the patient were reviewed by me and considered in my medical decision making (see chart for details).    MDM Rules/Calculators/A&P                       I have personally viewed the patient's x-ray which was done prehospital at the urgent care.  I see no signs of pneumothorax however there is what appears to be a large left-sided pleural effusion and/or infiltrate.  The patient will need a CT scan to further evaluate to see if the cause of this is potentially cancerous, infectious, most likely this is an empyema or parapneumonic effusion associated with a pneumonia.  His lab work has been ordered, code sepsis activated, antibiotics and IV fluids ordered.  His not in shock, he is not hypotensive, will hold on the fluid bolus at this time.  I  have personally viewed this patient's CT scan which shows a very large effusion likely an empyema, no other signs of metastatic disease or cancer in the chest.  The patient continues to be slightly tachypneic, meets sepsis criteria due to leukocytosis fever and tachycardia with some hypoxia.  Antibiotics have been ordered, the patient will need to be admitted.  I discussed the care with the interventional radiologist Dr. Kathlene Cote, they will be able to perform thoracentesis to remove fluid for sampling.  Discussed the case with the hospitalist Dr. Lorin Mercy who will admit.  Final Clinical Impression(s) / ED Diagnoses Final diagnoses:  Shortness of breath  Status post thoracentesis  Sepsis Empyema     Noemi Chapel, MD 08/13/19 [phone]

## 2019-08-12 NOTE — ED Notes (Signed)
Patient is being discharged from the Urgent Care and sent to the Emergency Department via wheelchair . Per Irma Newness., patient is in need of higher level of care due to concerning chest x-ray, tachypnea, tachycardia. Patient is aware and verbalizes understanding of plan of care.  Vitals:   08/12/19 1014  BP: 135/86  Pulse: (!) 110  Resp: (!) 22  Temp: 99.1 F (37.3 C)  SpO2: 100%

## 2019-08-12 NOTE — ED Provider Notes (Signed)
[place]    CSN: [id number] Arrival date & time: 08/12/19  [id number]      History   Chief Complaint Chief Complaint  Patient presents with  . Shortness of Breath    HPI Edwin Cook is a 24 y.o. 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  Patient reports that beginning Thursday he woke up with discomfort on the left side of his chest which would radiate to his shoulder.  He has had associated shortness of breath.  In the past couple of days he has also developed a frequent dry cough.  He denies associated URI symptoms of congestion, sore throat or rhinorrhea.  Denies fevers.  Does report some slight chills.  Denies abdominal pain nausea or vomiting.  Denies any close sick contacts.  Had Covid in January 2021 and completed 2 dose vaccine for Covid approximately 1 month ago.  He denies prior DVT/PE.  Denies leg pain or leg swelling.  Denies hypertension, diabetes.  Does report occasional vaping.  Has pain and discomfort within his chest and shoulder and upper back with certain movements as well as breathing.  He denies any specific injury trauma or increase in heavy lifting.  Works as a Marine scientist in the NICU.  HPI  Past Medical History:  Diagnosis Date  . Cancer [facility])    Ewing's sarcoma    Patient Active Problem List   Diagnosis Date Noted  . ADHD (attention deficit hyperactivity disorder), inattentive type 10/22/2017  . Anxiety 05/19/2017  . Ewing sarcoma ([place]) 07/28/2014  . Metatarsal stress fracture of right foot with delayed healing 05/09/2014  . Major depressive disorder with single episode, in partial remission ([place]) 07/16/2013  . Pain, joint, knee, left 05/11/2012  . Hip pain, right 01/27/2012  . Major depressive disorder 01/13/2012  . Obesity 07/25/2011  . Leg length difference, acquired 07/17/2011  . Fatigue 02/11/2011  . Intermittent fever of unknown origin 02/11/2011  . Status post right hip  replacement 01/07/2011    Past Surgical History:  Procedure Laterality Date  . FEMUR TUMOR RESECTION    . KNEE SURGERY    . KNEE SURGERY    . MEDIPORT INSERTION, SINGLE    . MEDIPORT REMOVAL         Home Medications    Prior to Admission medications   Medication Sig Start Date End Date Taking? Authorizing Provider  buPROPion (WELLBUTRIN XL) 300 MG 24 hr tablet Take 300 mg by mouth daily. 09/05/17   [provider]  busPIRone (BUSPAR) 10 MG tablet Take by mouth. 05/24/18   [provider]  ibuprofen (ADVIL) 800 MG tablet Take 1 tablet (800 mg total) by mouth every 6 (six) hours as needed for moderate pain. 04/07/19   Orpah Greek, MD  methylphenidate (RITALIN) 10 MG tablet Take 10 mg by mouth 2 (two) times daily. 07/22/17   [provider]  NON FORMULARY [facility] Wart Cream Cimetidine 2%; Deoxy-D-Glucose 0.2%; Fluorouracil 5%; Salicylic Acid 0000000 Faxed over on 05/24/18 with refills as needed    [provider]  propranolol (INDERAL) 10 MG tablet Take by mouth. 05/24/18   [provider]    Family History Family History  Problem Relation Age of Onset  . Diabetes Mother   . Hypertension Mother   . Thyroid disease Mother   . Asthma Father     Social History Social History   Tobacco Use  . Smoking status: Current Some Day  Smoker    Types: E-cigarettes  . Smokeless tobacco: Never Used  Substance Use Topics  . Alcohol use: No    Alcohol/week: 0.0 standard drinks  . Drug use: No     Allergies   Morphine and related   Review of Systems Review of Systems  Constitutional: Negative for activity change, appetite change, chills, fatigue and fever.  HENT: Negative for congestion, ear pain, rhinorrhea, sinus pressure, sore throat and trouble swallowing.   Eyes: Negative for discharge and redness.  Respiratory: Positive for cough and shortness of breath. Negative for chest tightness.   Cardiovascular: Negative for  chest pain.  Gastrointestinal: Negative for abdominal pain, diarrhea, nausea and vomiting.  Musculoskeletal: Negative for myalgias.  Skin: Negative for rash.  Neurological: Negative for dizziness, light-headedness and headaches.     Physical Exam Triage Vital Signs ED Triage Vitals  Enc Vitals Group     BP 08/12/19 1014 135/86     Pulse Rate 08/12/19 1014 (!) 110     Resp 08/12/19 1014 (!) 22     Temp 08/12/19 1014 99.1 F (37.3 C)     Temp Source 08/12/19 1014 Oral     SpO2 08/12/19 1014 100 %     Weight --      Height --      Head Circumference --      Peak Flow --      Pain Score 08/12/19 1015 3     Pain Loc --      Pain Edu? --      Excl. in [place]? --    No data found.  Updated Vital Signs BP 135/86 (BP Location: Left Arm)   Pulse (!) 110   Temp 99.1 F (37.3 C) (Oral)   Resp (!) 22   SpO2 100%   Visual Acuity Right Eye Distance:   Left Eye Distance:   Bilateral Distance:    Right Eye Near:   Left Eye Near:    Bilateral Near:     Physical Exam Vitals and nursing note reviewed.  Constitutional:      Appearance: He is well-developed.     Comments: No acute distress  HENT:     Head: Normocephalic and atraumatic.     Nose: Nose normal.  Eyes:     Conjunctiva/sclera: Conjunctivae normal.  Cardiovascular:     Rate and Rhythm: Tachycardia present.  Pulmonary:     Effort: No respiratory distress.     Comments: Slightly tachypneic, occasional dry cough in room, breath sounds diminished noted on left lower lung fields compared to right Abdominal:     General: There is no distension.  Musculoskeletal:        General: Normal range of motion.     Cervical back: Neck supple.     Comments: Diffuse tenderness to left anterior chest extending to shoulder/superior trapezius musculature  Bilateral lower leg symmetric without calf swelling or calf tenderness  Skin:    General: Skin is warm and dry.  Neurological:     Mental Status: He is alert and oriented to  person, place, and time.      UC Treatments / Results  Labs (all labs ordered are listed, but only abnormal results are displayed) Labs Reviewed - No data to display  EKG   Radiology DG Chest 2 View  Result Date: 08/12/2019 CLINICAL DATA:  Left-sided chest pain. EXAM: CHEST - 2 VIEW COMPARISON:  04/07/2019. FINDINGS: Mediastinum hilar structures normal. Heart size normal. Left lung infiltrate with large left  pleural effusion. No pneumothorax. Degenerative changes scoliosis thoracic spine. IMPRESSION: Left lung infiltrate with large left pleural effusion. No pneumothorax. Electronically Signed   By: Marcello Moores  Register   On: 08/12/2019 11:02    Procedures Procedures (including critical care time)  Medications Ordered in UC Medications - No data to display  Initial Impression / Assessment and Plan / UC Course  I have reviewed the triage vital signs and the nursing notes.  Pertinent labs & imaging results that were available during my care of the patient were reviewed by me and considered in my medical decision making (see chart for details).  Clinical Course as of Aug 11 1141  Fri Aug 12, 2019  1040 HR rechecked 107   [HW]  1048 EKG sinus tach, no acute signs of ischemia or infarction, has t wave flattening, stable from prior EKG from 04/07/2019   [HW]    Clinical Course User Index [HW] Wieters, Hallie C, PA-C    Significant left-sided effusion versus pneumonia.  Recommending further evaluation and work-up in emergency room, may need possible admission.  Patient verbalized understanding, stable on discharge, sent with nursing staff to emergency room.  Discussed strict return precautions. Patient verbalized understanding and is agreeable with plan.  Final Clinical Impressions(s) / UC Diagnoses   Final diagnoses:  SOB (shortness of breath)  Chest pain, unspecified type     Discharge Instructions     Please go to emergency room for further evaulation    ED  Prescriptions    None     PDMP not reviewed this encounter.   Janith Lima, [place] 08/12/19 1143

## 2019-08-12 NOTE — H&P (Addendum)
History and Physical    Edwin Cook AYT:[id number] DOB: [birth year] DOA: 08/12/2019  PCP: Patient, No Pcp Per Consultants:  Wilson - orthopedics; Rudi Rummage - hematology Patient coming from: Home - lives with mother and brother; Donald Prose: Mother, [phone]  Chief Complaint: SOB  HPI: Edwin Cook is a 24 y.o. male with medical history significant of Ewing's sarcoma ([place]) who presented to urgent care this AM with SOB.  He was found to have a "significant left-sided effusion vs. Pneumonia" and was sent to the ER.  He woke up Thursday with left side, shoulder, chest pain - thought he pulled a muscle.  He started feeling SOB that morning.  He took a warm shower and noticed cough with production with mild white sputum.  He still bad this AM and went to the urgent care.  They thought he had a PNA and sent him here.  He felt well Wednesday and was able to work.  Fever started today.  He is vaccinated for COVID.  No known sick contacts.  No prior h/o similar.  He did have chest wall pain a few months ago, did not have a CXR.    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  Presented as pneumonia but appears to be a large empyema.  Dr. Kathlene Cote to drain today.  Needs orders for thoracentesis.  Review of Systems: As per HPI; otherwise review of systems reviewed and negative.   Ambulatory Status:  Ambulates without assistance  COVID Vaccine Status:   Complete  Past Medical History:  Diagnosis Date  . Cancer ([place])    Ewing's sarcoma    Past Surgical History:  Procedure Laterality Date  . FEMUR TUMOR RESECTION    . KNEE SURGERY    . KNEE SURGERY    . MEDIPORT INSERTION, SINGLE    . MEDIPORT REMOVAL      Social History   Socioeconomic History  . Marital status: Single    Spouse name: Not on file  . Number of children: Not on file  . Years of education: Not on file  . Highest education level: Not on file  Occupational History  . Occupation: NICU nurse  Tobacco Use  . Smoking  status: Current Some Day Smoker    Types: E-cigarettes  . Smokeless tobacco: Never Used  . Tobacco comment: vapes occasionally, usually with drinking  Substance and Sexual Activity  . Alcohol use: Yes    Alcohol/week: 0.0 standard drinks    Comment: socially  . Drug use: No  . Sexual activity: Not on file  Other Topics Concern  . Not on file  Social History Narrative  . Not on file   Social Determinants of Health   Financial Resource Strain:   . Difficulty of Paying Living Expenses:   Food Insecurity:   . Worried About Charity fundraiser in the Last Year:   . Arboriculturist in the Last Year:   Transportation Needs:   . Film/video editor (Medical):   Marland Kitchen Lack of Transportation (Non-Medical):   Physical Activity:   . Days of Exercise per Week:   . Minutes of Exercise per Session:   Stress:   . Feeling of Stress :   Social Connections:   . Frequency of Communication with Friends and Family:   . Frequency of Social Gatherings with Friends and Family:   . Attends Religious Services:   . Active Member of Clubs or Organizations:   . Attends Archivist Meetings:   .  Marital Status:   Intimate Partner Violence:   . Fear of Current or Ex-Partner:   . Emotionally Abused:   Marland Kitchen Physically Abused:   . Sexually Abused:     Allergies  Allergen Reactions  . Morphine And Related Rash    Family History  Problem Relation Age of Onset  . Diabetes Mother   . Hypertension Mother   . Thyroid disease Mother   . Asthma Father     Prior to Admission medications   Medication Sig Start Date End Date Taking? Authorizing Provider  ibuprofen (ADVIL) 200 MG tablet Take 200 mg by mouth every 6 (six) hours as needed for fever, headache or moderate pain.   Yes [provider]  ibuprofen (ADVIL) 800 MG tablet Take 1 tablet (800 mg total) by mouth every 6 (six) hours as needed for moderate pain. Patient not taking: Reported on 08/12/2019 04/07/19   Orpah Greek,  MD    Physical Exam: Vitals:   08/12/19 1515 08/12/19 1530 08/12/19 1545 08/12/19 1600  BP: 107/66 109/62 109/73 109/62  Pulse: 97 92 98 100  Resp: (!) 29 (!) 24 (!) 32 (!) 28  Temp:      TempSrc:      SpO2: 94% 94% 95% 95%  Weight:      Height:         . General:  Appears calm and comfortable and is NAD . Eyes:  PERRL, EOMI, normal lids, iris . ENT:  grossly normal hearing, lips & tongue, mmm; appropriate dentition with one absent middle lower tooth . Neck:  no LAD, masses or thyromegaly . Cardiovascular:  RRR, no m/r/g. No LE edema.  Marland Kitchen Respiratory:   CTA on the right with no wheezes/rales/rhonchi.  Left lung has minimal air movement diffusely, scant movement in apex.  Normal respiratory effort. . Abdomen:  soft, NT, ND, NABS . Back:   normal alignment, no CVAT . Skin:  no rash or induration seen on limited exam . Musculoskeletal:  grossly normal tone BUE/BLE, good ROM, no bony abnormality . Psychiatric:  blunted mood and affect, speech fluent and appropriate, AOx3 . Neurologic:  CN 2-12 grossly intact, moves all extremities in coordinated fashion    Radiological Exams on Admission: DG Chest 2 View  Result Date: 08/12/2019 CLINICAL DATA:  Left-sided chest pain. EXAM: CHEST - 2 VIEW COMPARISON:  04/07/2019. FINDINGS: Mediastinum hilar structures normal. Heart size normal. Left lung infiltrate with large left pleural effusion. No pneumothorax. Degenerative changes scoliosis thoracic spine. IMPRESSION: Left lung infiltrate with large left pleural effusion. No pneumothorax. Electronically Signed   By: Marcello Moores  Register   On: 08/12/2019 11:02   CT Chest W Contrast  Result Date: 08/12/2019 CLINICAL DATA:  Pneumonia. EXAM: CT CHEST WITH CONTRAST TECHNIQUE: Multidetector CT imaging of the chest was performed during intravenous contrast administration. CONTRAST:  30m OMNIPAQUE IOHEXOL 300 MG/ML  SOLN COMPARISON:  Chest x-ray 08/12/2019 FINDINGS: Cardiovascular: The heart is normal in  size. No pericardial effusion. The aorta is normal in caliber. No dissection. No atherosclerotic calcifications. The branch vessels are normal. No coronary artery calcifications. Mediastinum/Nodes: Small scattered mediastinal and hilar lymph nodes, likely reactive. The esophagus is grossly normal. Lungs/Pleura: There is a large complex loculated left pleural fluid collection with significant compressive atelectasis of the left lung. Some aerated left upper lobe and a very small amount of aerated lingula. The left lower lobe is near completely atelectatic. I do not see any worrisome pleural enhancement or enhancing pleural lesions but empyema would  certainly be a consideration. Recommend thoracentesis. The right lung is clear. No right-sided pleural effusion. The central tracheobronchial tree is unremarkable. No obvious obstructing endobronchial lesion. Upper Abdomen: No significant upper abdominal findings. No hepatic or adrenal gland lesions. No upper abdominal adenopathy. Musculoskeletal: The thyroid gland is unremarkable. No supraclavicular or axillary adenopathy. Small scattered lymph nodes are noted. The bony thorax is intact.  No bone lesions. IMPRESSION: 1. Large complex loculated left pleural fluid collection with significant compressive atelectasis of the left lung. I do not see any worrisome pleural enhancement or enhancing pleural lesions but empyema would certainly be a consideration. Recommend thoracentesis. 2. Normal right lung.  No right pleural effusion. 3. No significant upper abdominal findings. Electronically Signed   By: Marijo Sanes M.D.   On: 08/12/2019 14:53    EKG: Independently reviewed.  Sinus tachycardia with rate 106; no evidence of acute ischemia   Labs on Admission: I have personally reviewed the available labs and imaging studies at the time of the admission.  Pertinent labs:   Unremarkable CMP Lactate 1.0 WBC 15.5 INR 1.2 UA: moderate Hgb, 30 protein COVID  pending   Assessment/Plan Principal Problem:   Sepsis due to pneumonia [facility]) Active Problems:   Ewing sarcoma ([place])   Obesity (BMI 30.0-34.9)   Empyema lung ([place])   Pneumonia with possible sepsis, probable empyema -SIRS criteria in this patient includes: Leukocytosis, fever, tachycardia, tachypnea  -Patient has no current evidence of acute organ failure -While awaiting blood cultures, this may be a preseptic condition. -Sepsis protocol initiated -Suspected source is left-sided diffuse PNA with probable empyema -Blood and urine cultures pending -Will admit due to: empyema, in need of procedure and ongoing IV antibiotics  -Treat with IV Rocephin/Azithromycin for now -Will add HIV -Will order lower respiratory tract procalcitonin level.  Antibiotics would not be indicated for PCT <0.1 and probably should not be used for < 0.25.  >0.5 indicates infection and >>0.5 indicates more serious disease.  As the procalcitonin level normalizes, it will be reasonable to consider de-escalation of antibiotic coverage. -Patient is being sent for thoracentesis this afternoon; labs ordered including cell count/differential; gram stain/culture; protein; glucose; LDH -If thoracentesis is unsuccessful (loculated collection), CVTS consult may need to be considered for possible VATS  H/o Ewing sarcoma -Diagnosed in 08/2008 after pathologic fracture -He was treated with compressed interval chemotherapy followed by radical resection and partial hip replacement -He has had no further issues -His mother is appropriately concerned about the new lung findings, although it would be unusual for his to have UL lung disease in the setting of metastatic disease so far removed from original tumor resection -Will check cytology on fluid -Will check ESR/CRP since his mother reports that this was basically his only abnormal finding when he was undergoing evaluation for Ewing's  Obesity Body mass index is 33.72  kg/m. -Weight loss should be encouraged -Outpatient PCP/bariatric medicine/bariatric surgery f/u encouraged     Note: This patient has been tested and is pending for the novel coronavirus COVID-19.  DVT prophylaxis:  Lovenox Code Status:  Full  Family Communication: Mother present throughout evaluation  Disposition Plan:  The patient is from: home  Anticipated d/c is to: home without [facility] services   Anticipated d/c date will depend on clinical response to treatment, likely 1-2 days  Patient is currently: acutely ill Consults called: IR Admission status: Admit - It is my clinical opinion that admission to INPATIENT is reasonable and necessary because of the expectation that this patient  will require hospital care that crosses at least 2 midnights to treat this condition based on the medical complexity of the problems presented.  Given the aforementioned information, the predictability of an adverse outcome is felt to be significant.  Karmen Bongo MD [facility]   How to contact the [facility] Attending or Consulting provider [place] or covering provider during after hours [place], for this patient?  1. Check the care team in [facility] and look for a) attending/consulting TRH provider listed and b) the [facility] team listed 2. Log into [email or web address] and use [place]'s universal password to access. If you do not have the password, please contact the hospital operator. 3. Locate the [facility] provider you are looking for under [facility] and page to a number that you can be directly reached. 4. If you still have difficulty reaching the provider, please page the [facility] (Director on Call) for the Hospitalists listed on amion for assistance.   08/12/2019, 4:29 PM

## 2019-08-12 NOTE — Procedures (Addendum)
Unsuccessful ultrasound-guided diagnostic and therapeutic thoracentesis. Patient had a vagal response followed by upper extremity and neck jerking for approximately 5 seconds. Patient slow to respond initially however his mental status recovered within 45-60 seconds. Patient pale and diaphoretic. Rapid RN and ED RN transferred patient back to ED. Chest xray ordered s/p thoracentesis .   Admitting Team made aware.

## 2019-08-12 NOTE — ED Triage Notes (Signed)
Pt reports chest pain for the past few days, last night began having difficulty breathing and coughing up phlegm, afebrile at home. Went to UC this morning and cxray showed L lobe pneumonia. Febrile in triage. A/ox4, resp e/u.

## 2019-08-12 NOTE — ED Notes (Signed)
Pt returned from [place]. Pt alert and oriented. Pt expressed he wishes to be transferred to [facility] for further evaluation and care. Pt is very concerned his cancer has returned and prefers to be at [facility] where they are familiar with his history and care.

## 2019-08-12 NOTE — ED Triage Notes (Signed)
Patient symptoms started Thursday with left sided pain, shortness of breath, occassional cough. Patient states the pain is worse with movement, laying flat, and deep breaths. Improved when sitting still or sitting up. Denies sick symptoms, fever, chills, sinus congestion, N/V/D. Denies sick/covid contacts. Reports being vaccinated over a month ago.

## 2019-08-12 NOTE — Discharge Instructions (Signed)
Please go to emergency room for further evaulation

## 2019-08-12 NOTE — Significant Event (Addendum)
Rapid Response Event Note  Overview: Vagal Episode >?  Initial Focused Assessment: Called urgently to [place], upon arrival, staff informed me that patient was undergoing a thoracentesis, during the procedure, patient had a vagal episode and then had jerking movements of the upper extremities and neck that lasted a few seconds. When I arrived, patient was alert and oriented, able to follow commands and moves all extremities, no vision deficits, and speech is clear. Patient was cool and diaphoretic, VS - BP 115/56 (74), HR 100s, RR 24, 94% on RA. Bilateral lung sounds present. ED RN came to [place], we brought the patient back to the ED. CXR ordered and completed.  Patient's mom was there, updated on what happened, patient and family request transfer to [facility]. ED RN to page [facility] MD on call to pursue that request.   I did speak with the [facility] MD about the events in [place], updated her. Expressed to her that the patient will need some form of sedation for procedure.   Plan: -- Patient to be admitted inpatient -- okay to eat now, NPO after midnight  Call Time 1814 Arrival Time 1816 End Time [place], [place]

## 2019-08-13 DIAGNOSIS — E669 Obesity, unspecified: Secondary | ICD-10-CM

## 2019-08-13 DIAGNOSIS — C419 Malignant neoplasm of bone and articular cartilage, unspecified: Secondary | ICD-10-CM

## 2019-08-13 DIAGNOSIS — J9601 Acute respiratory failure with hypoxia: Secondary | ICD-10-CM

## 2019-08-13 DIAGNOSIS — J869 Pyothorax without fistula: Secondary | ICD-10-CM

## 2019-08-13 LAB — CBC
HCT: 37.8 % — ABNORMAL LOW (ref 39.0–52.0)
Hemoglobin: 12.1 g/dL — ABNORMAL LOW (ref 13.0–17.0)
MCH: 29.4 pg (ref 26.0–34.0)
MCHC: 32 g/dL (ref 30.0–36.0)
MCV: 92 fL (ref 80.0–100.0)
Platelets: 278 10*3/uL (ref 150–400)
RBC: 4.11 MIL/uL — ABNORMAL LOW (ref 4.22–5.81)
RDW: 12.4 % (ref 11.5–15.5)
WBC: 11.1 10*3/uL — ABNORMAL HIGH (ref 4.0–10.5)
nRBC: 0 % (ref 0.0–0.2)

## 2019-08-13 LAB — BASIC METABOLIC PANEL
Anion gap: 11 (ref 5–15)
BUN: 11 mg/dL (ref 6–20)
CO2: 23 mmol/L (ref 22–32)
Calcium: 8.2 mg/dL — ABNORMAL LOW (ref 8.9–10.3)
Chloride: 102 mmol/L (ref 98–111)
Creatinine, Ser: 0.75 mg/dL (ref 0.61–1.24)
GFR calc Af Amer: >60 mL/min (ref >60)
GFR calc non Af Amer: >60 mL/min (ref >60)
Glucose, Bld: 103 mg/dL — ABNORMAL HIGH (ref 70–99)
Potassium: 3.7 mmol/L (ref 3.5–5.1)
Sodium: 136 mmol/L (ref 135–145)

## 2019-08-13 MED ORDER — BENZONATATE 100 MG PO CAPS: 100.0000 mg | ORAL_CAPSULE | Freq: Three times a day (TID) | ORAL | Status: DC | PRN

## 2019-08-13 NOTE — Progress Notes (Signed)
Hand off report  Given to RN Geanie Berlin) at [facility] .

## 2019-08-13 NOTE — Discharge Summary (Signed)
Discharge Summary  Edwin Cook [id number] DOB: 05/11/1995  PCP: Patient, No Pcp Per  Admit date: 08/12/2019 Discharge date: 08/13/2019  Time spent: 33mins, more than 50% time spent on coordination of care.   Patient is transferred to tertiary care center [facility] oncology service. Accepting MD Dr Marcell Anger.    Discharge Diagnoses:  Active Hospital Problems   Diagnosis Date Noted  . Sepsis due to pneumonia ([place]) 08/12/2019  . Empyema lung ([place]) 08/12/2019  . 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 Obesity (BMI 30.0-34.9) 07/25/2011    Resolved Hospital Problems  No resolved problems to display.    Discharge Condition: stable  Diet recommendation: Regular diet  Filed Weights   08/12/19 1123 08/12/19 1957  Weight: 106.6 kg 106.6 kg    History of present illness: (per admitting Md Dr Lorin Mercy) Patient coming from: Home - lives with mother and brother; NOK: Mother, [phone]  Chief Complaint: SOB  HPI: Edwin Cook is a 24 y.o. male with medical history significant of Ewing's sarcoma (remote) who presented to urgent care this AM with SOB.  He was found to have a "significant left-sided effusion vs. Pneumonia" and was sent to the ER.  He woke up Thursday with left side, shoulder, chest pain - thought he pulled a muscle.  He started feeling SOB that morning.  He took a warm shower and noticed cough with production with mild white sputum.  He still bad this AM and went to the urgent care.  They thought he had a PNA and sent him here.  He felt well Wednesday and was able to work.  Fever started today.  He is vaccinated for COVID.  No known sick contacts.  No prior h/o similar.  He did have chest wall pain a few months ago, did not have a CXR.    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  Presented as pneumonia but appears to be a large empyema.  Dr. Kathlene Cote to drain today.  Needs orders for thoracentesis.   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  He does cough, no tachypneic at rest, does not use accessory muscle -He does has fever on presentation, fever 102.5, Initial on presentation he was tachypneic respiratory rate ranged from 20-29, this morning respiration rate has been normal, initially on presentation he has sinus tachycardia in the 110s, this has improved this morning as well -He does has leukocytosis WBC 15.5 on presentation, has elevated sed rate and CRP, normal lactic acid, procalcitonin 1.37 -Blood culture no growth, SARS-CoV-2 screening negative, sputum culture pending collection -Is treated with Rocephin and Zithromax,  -Ultrasound-guided thoracentesis attempted, but patient could not tolerate the procedure , had vasovagal syncope  -Patient requests transfer to tertiary care center   Vasovagal syncope versus seizure -No prior seizure history, he did not have confusion after the brief episode -Patient would like to have EEG to rule out seizure though   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   Procedures:  Failed attempt ,ultrasound-guided thoracentesis  Consultations:  Interventional radiology  [facility] oncology Dr. Evette Doffing and Dr. Marcell Anger  Discharge Exam: BP 111/64 (BP Location: Right Arm)   Pulse 82   Temp 98.9 F (37.2 C) (Oral)   Resp 17   Ht 5\' 10"  (1.778 m)   Wt 106.6 kg   SpO2 94%   BMI 33.72 kg/m   General: NAD Cardiovascular: RRR Respiratory: normal respiratory effort  Discharge Instructions You were cared for by a hospitalist  during your hospital stay. If you have any questions about your discharge medications or the care you received while you were in the hospital after you are discharged, you can call the unit and asked to speak with the hospitalist on call if the hospitalist that took care of you is not available. Once you are discharged, your primary care physician will handle any further medical issues. Please note that NO REFILLS for any discharge medications will be authorized once you are discharged, as it is imperative that you return to your primary care physician (or establish a relationship with a primary care physician if you do not have one) for your aftercare needs so that they can reassess your need for medications and monitor your lab values.  Discharge Instructions    Diet general   Complete by: As directed    Increase activity slowly   Complete by: As directed      Allergies as of 08/13/2019      Reactions   Morphine And Related Rash      Medication List    STOP taking these medications   ibuprofen 200 MG tablet Commonly known as: ADVIL      Allergies  Allergen Reactions  . Morphine And Related Rash      The results of significant diagnostics from this hospitalization (including imaging, microbiology, ancillary and laboratory) are listed below for reference.    Significant Diagnostic Studies: DG Chest 2 View  Result Date: 08/12/2019 CLINICAL DATA:  Left-sided chest pain. EXAM: CHEST - 2 VIEW COMPARISON:  04/07/2019. FINDINGS: Mediastinum hilar structures normal. Heart size normal. Left lung infiltrate with large left pleural effusion. No pneumothorax. Degenerative changes scoliosis thoracic spine. IMPRESSION: Left lung infiltrate with large left pleural effusion. No pneumothorax. Electronically Signed   By: Marcello Moores  Register   On: 08/12/2019 11:02   CT Chest W Contrast  Result Date: 08/12/2019 CLINICAL DATA:  Pneumonia. EXAM: CT CHEST WITH CONTRAST TECHNIQUE: Multidetector CT imaging  of the chest was performed during intravenous contrast administration. CONTRAST:  51mL OMNIPAQUE IOHEXOL 300 MG/ML  SOLN COMPARISON:  Chest x-ray 08/12/2019 FINDINGS: Cardiovascular: The heart is normal in size. No pericardial effusion. The aorta is normal in caliber. No dissection. No atherosclerotic calcifications. The branch vessels are normal. No coronary artery calcifications. Mediastinum/Nodes: Small scattered mediastinal and hilar lymph nodes, likely reactive. The esophagus is grossly normal. Lungs/Pleura: There is a large complex loculated left pleural fluid collection with significant compressive atelectasis of the left lung. Some aerated left upper lobe and a very small amount of aerated lingula. The left lower lobe is near completely atelectatic. I do not see any worrisome pleural enhancement or enhancing pleural lesions but empyema would certainly be a consideration. Recommend thoracentesis. The right lung is clear. No right-sided pleural effusion. The central tracheobronchial tree is unremarkable. No obvious obstructing endobronchial lesion. Upper Abdomen: No significant upper abdominal findings. No hepatic or adrenal gland lesions. No upper abdominal adenopathy. Musculoskeletal: The thyroid gland is unremarkable. No supraclavicular or axillary adenopathy. Small scattered lymph nodes are noted.  The bony thorax is intact.  No bone lesions. IMPRESSION: 1. Large complex loculated left pleural fluid collection with significant compressive atelectasis of the left lung. I do not see any worrisome pleural enhancement or enhancing pleural lesions but empyema would certainly be a consideration. Recommend thoracentesis. 2. Normal right lung.  No right pleural effusion. 3. No significant upper abdominal findings. Electronically Signed   By: Marijo Sanes M.D.   On: 08/12/2019 14:53   DG Chest Port 1 View  Result Date: 08/12/2019 CLINICAL DATA:  Post thoracentesis EXAM: PORTABLE CHEST 1 VIEW COMPARISON:  Chest  x-ray 08/12/2019, CT chest 08/12/2019 FINDINGS: Right lung is grossly clear. Large loculated left pleural effusion, appears increased radiographically. No pneumothorax. The cardiomediastinal silhouette is obscured. IMPRESSION: Large loculated left pleural effusion appears radiographically increased compared with examination earlier today. There is no pneumothorax. Electronically Signed   By: Donavan Foil M.D.   On: 08/12/2019 19:24   US THORACENTESIS ASP PLEURAL SPACE W/IMG GUIDE  Result Date: 08/13/2019 INDICATION: Patient history of Ewing sarcoma presented this facility with chest pressure and shortness of breath found to have a loculated left-sided pleural effusion presents for therapeutic and diagnostic thoracentesis EXAM: ULTRASOUND GUIDED THERAPEUTIC AND DIAGNOSTIC THORACENTESIS MEDICATIONS: LIDOCAINE 1% 10 ML COMPLICATIONS: Patient had a vagal response during the procedure with upper extremity jerking. The procedure was terminated and no fluid was removed PROCEDURE: An ultrasound guided thoracentesis was thoroughly discussed with the patient and questions answered. The benefits, risks, alternatives and complications were also discussed. The patient understands and wishes to proceed with the procedure. Written consent was obtained. Ultrasound was performed to localize and mark an adequate pocket of fluid in the left-sided chest. The area was then prepped and draped in the normal sterile fashion. 1% Lidocaine was used for local anesthesia. Under ultrasound guidance a 6 Fr Safe-T-Centesis catheter was introduced. The procedure was then aborted due to patient condition. FINDINGS: No fluid was removed IMPRESSION: Unsuccessful ultrasound guided left-sided therapeutic and diagnostic thoracentesis. Electronically Signed   By: Aletta Edouard M.D.   On: 08/12/2019 18:49    Microbiology: Recent Results (from the past 240 hour(s))  Blood Culture (routine x 2)     Status: None (Preliminary result)   Collection  Time: 08/12/19 12:37 PM   Specimen: BLOOD LEFT HAND  Result Value Ref Range Status   Specimen Description BLOOD LEFT HAND  Final   Special Requests   Final    BOTTLES DRAWN AEROBIC AND ANAEROBIC Blood Culture results may not be optimal due to an inadequate volume of blood received in culture bottles   Culture   Final    NO GROWTH < 24 HOURS Performed at [facility], [place] [address], [place], [place] [address]    Report Status PENDING  Incomplete  Blood Culture (routine x 2)     Status: None (Preliminary result)   Collection Time: 08/12/19 12:37 PM   Specimen: BLOOD  Result Value Ref Range Status   Specimen Description BLOOD RIGHT ANTECUBITAL  Final   Special Requests   Final    BOTTLES DRAWN AEROBIC AND ANAEROBIC Blood Culture results may not be optimal due to an inadequate volume of blood received in culture bottles   Culture   Final    NO GROWTH < 24 HOURS Performed at [facility], [place] [address], [place], [place] [address]    Report Status PENDING  Incomplete  SARS Coronavirus 2 by RT PCR (hospital order, performed in [facility] hospital lab)  Nasopharyngeal Nasopharyngeal Swab     Status: None   Collection Time: 08/12/19  3:14 PM   Specimen: Nasopharyngeal Swab  Result Value Ref Range Status   SARS Coronavirus 2 NEGATIVE NEGATIVE Final    Comment: (NOTE) SARS-CoV-2 target nucleic acids are NOT DETECTED. The SARS-CoV-2 RNA is generally detectable in upper and lower respiratory specimens during the acute phase of infection. The lowest concentration of SARS-CoV-2 viral copies this assay can detect is 250 copies / mL. A negative result does not preclude SARS-CoV-2 infection and should not be used as the sole basis for treatment or other patient management decisions.  A negative result may occur with improper specimen collection / handling, submission of specimen other than nasopharyngeal swab, presence of viral mutation(s) within the areas targeted by this assay,  and inadequate number of viral copies (<250 copies / mL). A negative result must be combined with clinical observations, patient history, and epidemiological information. Fact Sheet for Patients:   StrictlyIdeas.no Fact Sheet for Healthcare Providers: BankingDealers.co.za This test is not yet approved or cleared  by the [place] FDA and has been authorized for detection and/or diagnosis of SARS-CoV-2 by FDA under an Emergency Use Authorization (EUA).  This EUA will remain in effect (meaning this test can be used) for the duration of the COVID-19 declaration under Section 564(b)(1) of the Act, 21 U.S.C. section 360bbb-3(b)(1), unless the authorization is terminated or revoked sooner. Performed at [facility], [place] [address], [place], [place] [address]      Labs: Basic Metabolic Panel: Recent Labs  Lab 08/12/19 1237 08/13/19 0428  NA 134* 136  K 3.9 3.7  CL 102 102  CO2 20* 23  GLUCOSE 93 103*  BUN 10 11  CREATININE 0.74 0.75  CALCIUM 8.9 8.2*   Liver Function Tests: Recent Labs  Lab 08/12/19 1237  AST 14*  ALT 18  ALKPHOS 85  BILITOT 1.1  PROT 7.4  ALBUMIN 3.6   No results for input(s): LIPASE, AMYLASE in the last 168 hours. No results for input(s): AMMONIA in the last 168 hours. CBC: Recent Labs  Lab 08/12/19 1237 08/13/19 0428  WBC 15.5* 11.1*  NEUTROABS 12.0*  --   HGB 13.2 12.1*  HCT 39.2 37.8*  MCV 90.1 92.0  PLT 284 278   Cardiac Enzymes: No results for input(s): CKTOTAL, CKMB, CKMBINDEX, TROPONINI in the last 168 hours. BNP: BNP (last 3 results) No results for input(s): BNP in the last 8760 hours.  ProBNP (last 3 results) No results for input(s): PROBNP in the last 8760 hours.  CBG: No results for input(s): GLUCAP in the last 168 hours.     Signed:  Florencia Reasons MD, PhD, FACP  [facility] 08/13/2019, 11:32 AM

## 2019-08-14 MED ORDER — GENERIC EXTERNAL MEDICATION
1.50 | Status: DC
Start: 2019-08-16 — End: 2019-08-14

## 2019-08-14 MED ORDER — TRAMADOL HCL 50 MG PO TABS
50.00 | ORAL_TABLET | ORAL | Status: DC
Start: ? — End: 2019-08-14

## 2019-08-14 MED ORDER — BENZONATATE 100 MG PO CAPS
100.00 | ORAL_CAPSULE | ORAL | Status: DC
Start: ? — End: 2019-08-14

## 2019-08-14 MED ORDER — ACETAMINOPHEN 325 MG PO TABS
650.00 | ORAL_TABLET | ORAL | Status: DC
Start: ? — End: 2019-08-14

## 2019-08-14 MED ORDER — OXYCODONE HCL 5 MG PO TABS
5.00 | ORAL_TABLET | ORAL | Status: DC
Start: ? — End: 2019-08-14

## 2019-08-14 MED ORDER — ONDANSETRON HCL 4 MG PO TABS
4.00 | ORAL_TABLET | ORAL | Status: DC
Start: ? — End: 2019-08-14

## 2019-08-14 MED ORDER — ACETAMINOPHEN 325 MG PO TABS
650.00 | ORAL_TABLET | ORAL | Status: DC
Start: 2019-08-18 — End: 2019-08-14

## 2019-08-14 MED ORDER — LACTATED RINGERS IV SOLN
INTRAVENOUS | Status: DC
Start: ? — End: 2019-08-14

## 2019-08-14 MED ORDER — GENERIC EXTERNAL MEDICATION
1.00 | Status: DC
Start: 2019-08-16 — End: 2019-08-14

## 2019-08-15 LAB — URINE CULTURE: Culture: 1000 — AB

## 2019-08-16 MED ORDER — LIDOCAINE 4 % EX PTCH
1.00 | MEDICATED_PATCH | CUTANEOUS | Status: DC
Start: 2019-08-19 — End: 2019-08-16

## 2019-08-16 MED ORDER — TRAMADOL HCL 50 MG PO TABS
50.00 | ORAL_TABLET | ORAL | Status: DC
Start: ? — End: 2019-08-16

## 2019-08-16 MED ORDER — KETOROLAC TROMETHAMINE 15 MG/ML IJ SOLN
15.00 | INTRAMUSCULAR | Status: DC
Start: ? — End: 2019-08-16

## 2019-08-16 MED ORDER — OXYCODONE HCL 5 MG PO TABS
5.00 | ORAL_TABLET | ORAL | Status: DC
Start: ? — End: 2019-08-16

## 2019-08-17 LAB — CULTURE, BLOOD (ROUTINE X 2)
Culture: NO GROWTH
Culture: NO GROWTH

## 2019-08-18 MED ORDER — SODIUM CHLORIDE 0.9 % IV SOLN
INTRAVENOUS | Status: DC
Start: ? — End: 2019-08-18

## 2019-08-20 MED ORDER — INDOMETHACIN 25 MG PO CAPS
25.00 | ORAL_CAPSULE | ORAL | Status: DC
Start: 2019-08-21 — End: 2019-08-20

## 2019-08-20 MED ORDER — GENERIC EXTERNAL MEDICATION
Status: DC
Start: ? — End: 2019-08-20

## 2019-08-20 MED ORDER — SENNOSIDES 8.6 MG PO TABS
17.20 | ORAL_TABLET | ORAL | Status: DC
Start: ? — End: 2019-08-20

## 2019-08-20 MED ORDER — OXYCODONE HCL 5 MG PO TABS
5.00 | ORAL_TABLET | ORAL | Status: DC
Start: ? — End: 2019-08-20

## 2019-10-21 DIAGNOSIS — C7802 Secondary malignant neoplasm of left lung: Secondary | ICD-10-CM | POA: Diagnosis not present

## 2019-10-21 DIAGNOSIS — C4021 Malignant neoplasm of long bones of right lower limb: Secondary | ICD-10-CM | POA: Diagnosis not present

## 2019-10-21 DIAGNOSIS — Z5111 Encounter for antineoplastic chemotherapy: Secondary | ICD-10-CM | POA: Diagnosis not present

## 2019-12-01 DIAGNOSIS — Z7689 Persons encountering health services in other specified circumstances: Secondary | ICD-10-CM | POA: Diagnosis not present

## 2019-12-02 DIAGNOSIS — Z7689 Persons encountering health services in other specified circumstances: Secondary | ICD-10-CM | POA: Diagnosis not present

## 2019-12-06 DIAGNOSIS — Z7689 Persons encountering health services in other specified circumstances: Secondary | ICD-10-CM | POA: Diagnosis not present

## 2019-12-08 DIAGNOSIS — Z7689 Persons encountering health services in other specified circumstances: Secondary | ICD-10-CM | POA: Diagnosis not present

## 2019-12-09 DIAGNOSIS — Z7689 Persons encountering health services in other specified circumstances: Secondary | ICD-10-CM | POA: Diagnosis not present

## 2019-12-12 DIAGNOSIS — Z7689 Persons encountering health services in other specified circumstances: Secondary | ICD-10-CM | POA: Diagnosis not present

## 2019-12-14 DIAGNOSIS — Z7689 Persons encountering health services in other specified circumstances: Secondary | ICD-10-CM | POA: Diagnosis not present

## 2019-12-16 DIAGNOSIS — Z419 Encounter for procedure for purposes other than remedying health state, unspecified: Secondary | ICD-10-CM | POA: Diagnosis not present

## 2019-12-19 DIAGNOSIS — Z7689 Persons encountering health services in other specified circumstances: Secondary | ICD-10-CM | POA: Diagnosis not present

## 2019-12-20 DIAGNOSIS — Z7689 Persons encountering health services in other specified circumstances: Secondary | ICD-10-CM | POA: Diagnosis not present

## 2019-12-21 DIAGNOSIS — Z7689 Persons encountering health services in other specified circumstances: Secondary | ICD-10-CM | POA: Diagnosis not present

## 2019-12-22 DIAGNOSIS — Z7689 Persons encountering health services in other specified circumstances: Secondary | ICD-10-CM | POA: Diagnosis not present

## 2019-12-23 DIAGNOSIS — Z7689 Persons encountering health services in other specified circumstances: Secondary | ICD-10-CM | POA: Diagnosis not present

## 2020-01-05 DIAGNOSIS — Z7689 Persons encountering health services in other specified circumstances: Secondary | ICD-10-CM | POA: Diagnosis not present

## 2020-01-09 DIAGNOSIS — Z7689 Persons encountering health services in other specified circumstances: Secondary | ICD-10-CM | POA: Diagnosis not present

## 2020-01-10 DIAGNOSIS — Z7689 Persons encountering health services in other specified circumstances: Secondary | ICD-10-CM | POA: Diagnosis not present

## 2020-01-11 DIAGNOSIS — Z7689 Persons encountering health services in other specified circumstances: Secondary | ICD-10-CM | POA: Diagnosis not present

## 2020-01-12 DIAGNOSIS — Z7689 Persons encountering health services in other specified circumstances: Secondary | ICD-10-CM | POA: Diagnosis not present

## 2020-01-13 DIAGNOSIS — Z7689 Persons encountering health services in other specified circumstances: Secondary | ICD-10-CM | POA: Diagnosis not present

## 2020-01-16 DIAGNOSIS — Z419 Encounter for procedure for purposes other than remedying health state, unspecified: Secondary | ICD-10-CM | POA: Diagnosis not present

## 2020-01-30 DIAGNOSIS — Z7689 Persons encountering health services in other specified circumstances: Secondary | ICD-10-CM | POA: Diagnosis not present

## 2020-01-31 DIAGNOSIS — Z7689 Persons encountering health services in other specified circumstances: Secondary | ICD-10-CM | POA: Diagnosis not present

## 2020-02-01 DIAGNOSIS — Z7689 Persons encountering health services in other specified circumstances: Secondary | ICD-10-CM | POA: Diagnosis not present

## 2020-02-02 DIAGNOSIS — Z7689 Persons encountering health services in other specified circumstances: Secondary | ICD-10-CM | POA: Diagnosis not present

## 2020-02-03 DIAGNOSIS — Z7689 Persons encountering health services in other specified circumstances: Secondary | ICD-10-CM | POA: Diagnosis not present

## 2020-02-14 DIAGNOSIS — Z7689 Persons encountering health services in other specified circumstances: Secondary | ICD-10-CM | POA: Diagnosis not present

## 2020-02-15 DIAGNOSIS — Z419 Encounter for procedure for purposes other than remedying health state, unspecified: Secondary | ICD-10-CM | POA: Diagnosis not present

## 2020-02-16 DIAGNOSIS — Z7689 Persons encountering health services in other specified circumstances: Secondary | ICD-10-CM | POA: Diagnosis not present

## 2020-02-20 DIAGNOSIS — Z7689 Persons encountering health services in other specified circumstances: Secondary | ICD-10-CM | POA: Diagnosis not present

## 2020-02-21 DIAGNOSIS — Z7689 Persons encountering health services in other specified circumstances: Secondary | ICD-10-CM | POA: Diagnosis not present

## 2020-02-22 DIAGNOSIS — Z7689 Persons encountering health services in other specified circumstances: Secondary | ICD-10-CM | POA: Diagnosis not present

## 2020-02-23 DIAGNOSIS — Z7689 Persons encountering health services in other specified circumstances: Secondary | ICD-10-CM | POA: Diagnosis not present

## 2020-02-24 DIAGNOSIS — Z7689 Persons encountering health services in other specified circumstances: Secondary | ICD-10-CM | POA: Diagnosis not present

## 2020-03-08 DIAGNOSIS — Z7689 Persons encountering health services in other specified circumstances: Secondary | ICD-10-CM | POA: Diagnosis not present

## 2020-03-12 DIAGNOSIS — Z7689 Persons encountering health services in other specified circumstances: Secondary | ICD-10-CM | POA: Diagnosis not present

## 2020-03-13 DIAGNOSIS — Z7689 Persons encountering health services in other specified circumstances: Secondary | ICD-10-CM | POA: Diagnosis not present

## 2020-03-14 DIAGNOSIS — Z7689 Persons encountering health services in other specified circumstances: Secondary | ICD-10-CM | POA: Diagnosis not present

## 2020-03-15 DIAGNOSIS — Z7689 Persons encountering health services in other specified circumstances: Secondary | ICD-10-CM | POA: Diagnosis not present

## 2020-03-17 DIAGNOSIS — Z419 Encounter for procedure for purposes other than remedying health state, unspecified: Secondary | ICD-10-CM | POA: Diagnosis not present

## 2020-03-26 DIAGNOSIS — Z7689 Persons encountering health services in other specified circumstances: Secondary | ICD-10-CM | POA: Diagnosis not present

## 2020-03-29 DIAGNOSIS — Z7689 Persons encountering health services in other specified circumstances: Secondary | ICD-10-CM | POA: Diagnosis not present

## 2020-04-16 DIAGNOSIS — Z7689 Persons encountering health services in other specified circumstances: Secondary | ICD-10-CM | POA: Diagnosis not present

## 2020-04-17 DIAGNOSIS — Z7689 Persons encountering health services in other specified circumstances: Secondary | ICD-10-CM | POA: Diagnosis not present

## 2020-04-17 DIAGNOSIS — Z419 Encounter for procedure for purposes other than remedying health state, unspecified: Secondary | ICD-10-CM | POA: Diagnosis not present

## 2020-04-18 DIAGNOSIS — Z7689 Persons encountering health services in other specified circumstances: Secondary | ICD-10-CM | POA: Diagnosis not present

## 2020-04-19 DIAGNOSIS — Z7689 Persons encountering health services in other specified circumstances: Secondary | ICD-10-CM | POA: Diagnosis not present

## 2020-04-20 DIAGNOSIS — Z7689 Persons encountering health services in other specified circumstances: Secondary | ICD-10-CM | POA: Diagnosis not present

## 2020-05-03 DIAGNOSIS — Z7689 Persons encountering health services in other specified circumstances: Secondary | ICD-10-CM | POA: Diagnosis not present

## 2020-05-07 DIAGNOSIS — Z7689 Persons encountering health services in other specified circumstances: Secondary | ICD-10-CM | POA: Diagnosis not present

## 2020-05-08 DIAGNOSIS — Z7689 Persons encountering health services in other specified circumstances: Secondary | ICD-10-CM | POA: Diagnosis not present

## 2020-05-09 DIAGNOSIS — Z7689 Persons encountering health services in other specified circumstances: Secondary | ICD-10-CM | POA: Diagnosis not present

## 2020-05-10 DIAGNOSIS — Z7689 Persons encountering health services in other specified circumstances: Secondary | ICD-10-CM | POA: Diagnosis not present

## 2020-05-11 DIAGNOSIS — Z7689 Persons encountering health services in other specified circumstances: Secondary | ICD-10-CM | POA: Diagnosis not present

## 2020-05-15 DIAGNOSIS — Z419 Encounter for procedure for purposes other than remedying health state, unspecified: Secondary | ICD-10-CM | POA: Diagnosis not present

## 2020-05-28 DIAGNOSIS — C4021 Malignant neoplasm of long bones of right lower limb: Secondary | ICD-10-CM | POA: Diagnosis not present

## 2020-05-28 DIAGNOSIS — Z79899 Other long term (current) drug therapy: Secondary | ICD-10-CM | POA: Diagnosis not present

## 2020-05-28 DIAGNOSIS — Z87891 Personal history of nicotine dependence: Secondary | ICD-10-CM | POA: Diagnosis not present

## 2020-05-28 DIAGNOSIS — Z5111 Encounter for antineoplastic chemotherapy: Secondary | ICD-10-CM | POA: Diagnosis not present

## 2020-05-28 DIAGNOSIS — C7802 Secondary malignant neoplasm of left lung: Secondary | ICD-10-CM | POA: Diagnosis not present

## 2020-05-29 DIAGNOSIS — D649 Anemia, unspecified: Secondary | ICD-10-CM | POA: Diagnosis not present

## 2020-05-29 DIAGNOSIS — C7802 Secondary malignant neoplasm of left lung: Secondary | ICD-10-CM | POA: Diagnosis not present

## 2020-05-29 DIAGNOSIS — Z87891 Personal history of nicotine dependence: Secondary | ICD-10-CM | POA: Diagnosis not present

## 2020-05-29 DIAGNOSIS — F419 Anxiety disorder, unspecified: Secondary | ICD-10-CM | POA: Diagnosis not present

## 2020-05-29 DIAGNOSIS — C4021 Malignant neoplasm of long bones of right lower limb: Secondary | ICD-10-CM | POA: Diagnosis not present

## 2020-05-29 DIAGNOSIS — Z79899 Other long term (current) drug therapy: Secondary | ICD-10-CM | POA: Diagnosis not present

## 2020-05-30 DIAGNOSIS — C7802 Secondary malignant neoplasm of left lung: Secondary | ICD-10-CM | POA: Diagnosis not present

## 2020-05-30 DIAGNOSIS — Z79899 Other long term (current) drug therapy: Secondary | ICD-10-CM | POA: Diagnosis not present

## 2020-05-30 DIAGNOSIS — C4021 Malignant neoplasm of long bones of right lower limb: Secondary | ICD-10-CM | POA: Diagnosis not present

## 2020-05-30 DIAGNOSIS — Z5111 Encounter for antineoplastic chemotherapy: Secondary | ICD-10-CM | POA: Diagnosis not present

## 2020-05-31 DIAGNOSIS — Z79899 Other long term (current) drug therapy: Secondary | ICD-10-CM | POA: Diagnosis not present

## 2020-05-31 DIAGNOSIS — C7802 Secondary malignant neoplasm of left lung: Secondary | ICD-10-CM | POA: Diagnosis not present

## 2020-05-31 DIAGNOSIS — Z5111 Encounter for antineoplastic chemotherapy: Secondary | ICD-10-CM | POA: Diagnosis not present

## 2020-05-31 DIAGNOSIS — C4021 Malignant neoplasm of long bones of right lower limb: Secondary | ICD-10-CM | POA: Diagnosis not present

## 2020-06-01 DIAGNOSIS — Z7689 Persons encountering health services in other specified circumstances: Secondary | ICD-10-CM | POA: Diagnosis not present

## 2020-06-01 DIAGNOSIS — Z5111 Encounter for antineoplastic chemotherapy: Secondary | ICD-10-CM | POA: Diagnosis not present

## 2020-06-01 DIAGNOSIS — C7802 Secondary malignant neoplasm of left lung: Secondary | ICD-10-CM | POA: Diagnosis not present

## 2020-06-01 DIAGNOSIS — Z79899 Other long term (current) drug therapy: Secondary | ICD-10-CM | POA: Diagnosis not present

## 2020-06-01 DIAGNOSIS — C4021 Malignant neoplasm of long bones of right lower limb: Secondary | ICD-10-CM | POA: Diagnosis not present

## 2020-06-06 DIAGNOSIS — Z7689 Persons encountering health services in other specified circumstances: Secondary | ICD-10-CM | POA: Diagnosis not present

## 2020-06-07 DIAGNOSIS — Z7689 Persons encountering health services in other specified circumstances: Secondary | ICD-10-CM | POA: Diagnosis not present

## 2020-06-12 DIAGNOSIS — Z7689 Persons encountering health services in other specified circumstances: Secondary | ICD-10-CM | POA: Diagnosis not present

## 2020-06-12 DIAGNOSIS — C4021 Malignant neoplasm of long bones of right lower limb: Secondary | ICD-10-CM | POA: Diagnosis not present

## 2020-06-12 DIAGNOSIS — C7802 Secondary malignant neoplasm of left lung: Secondary | ICD-10-CM | POA: Diagnosis not present

## 2020-06-12 DIAGNOSIS — Z96641 Presence of right artificial hip joint: Secondary | ICD-10-CM | POA: Diagnosis not present

## 2020-06-15 DIAGNOSIS — Z419 Encounter for procedure for purposes other than remedying health state, unspecified: Secondary | ICD-10-CM | POA: Diagnosis not present

## 2020-06-18 DIAGNOSIS — Z5111 Encounter for antineoplastic chemotherapy: Secondary | ICD-10-CM | POA: Diagnosis not present

## 2020-06-18 DIAGNOSIS — Z7689 Persons encountering health services in other specified circumstances: Secondary | ICD-10-CM | POA: Diagnosis not present

## 2020-06-18 DIAGNOSIS — C4021 Malignant neoplasm of long bones of right lower limb: Secondary | ICD-10-CM | POA: Diagnosis not present

## 2020-06-18 DIAGNOSIS — Z79899 Other long term (current) drug therapy: Secondary | ICD-10-CM | POA: Diagnosis not present

## 2020-06-18 DIAGNOSIS — C7802 Secondary malignant neoplasm of left lung: Secondary | ICD-10-CM | POA: Diagnosis not present

## 2020-06-19 DIAGNOSIS — Z79899 Other long term (current) drug therapy: Secondary | ICD-10-CM | POA: Diagnosis not present

## 2020-06-19 DIAGNOSIS — F32A Depression, unspecified: Secondary | ICD-10-CM | POA: Diagnosis not present

## 2020-06-19 DIAGNOSIS — C4021 Malignant neoplasm of long bones of right lower limb: Secondary | ICD-10-CM | POA: Diagnosis not present

## 2020-06-19 DIAGNOSIS — Z87891 Personal history of nicotine dependence: Secondary | ICD-10-CM | POA: Diagnosis not present

## 2020-06-19 DIAGNOSIS — D649 Anemia, unspecified: Secondary | ICD-10-CM | POA: Diagnosis not present

## 2020-06-19 DIAGNOSIS — E669 Obesity, unspecified: Secondary | ICD-10-CM | POA: Diagnosis not present

## 2020-06-19 DIAGNOSIS — Z7689 Persons encountering health services in other specified circumstances: Secondary | ICD-10-CM | POA: Diagnosis not present

## 2020-06-19 DIAGNOSIS — F419 Anxiety disorder, unspecified: Secondary | ICD-10-CM | POA: Diagnosis not present

## 2020-06-19 DIAGNOSIS — C7802 Secondary malignant neoplasm of left lung: Secondary | ICD-10-CM | POA: Diagnosis not present

## 2020-06-20 DIAGNOSIS — Z79899 Other long term (current) drug therapy: Secondary | ICD-10-CM | POA: Diagnosis not present

## 2020-06-20 DIAGNOSIS — Z5111 Encounter for antineoplastic chemotherapy: Secondary | ICD-10-CM | POA: Diagnosis not present

## 2020-06-20 DIAGNOSIS — C4021 Malignant neoplasm of long bones of right lower limb: Secondary | ICD-10-CM | POA: Diagnosis not present

## 2020-06-20 DIAGNOSIS — Z7689 Persons encountering health services in other specified circumstances: Secondary | ICD-10-CM | POA: Diagnosis not present

## 2020-06-20 DIAGNOSIS — C7802 Secondary malignant neoplasm of left lung: Secondary | ICD-10-CM | POA: Diagnosis not present

## 2020-06-21 DIAGNOSIS — Z87891 Personal history of nicotine dependence: Secondary | ICD-10-CM | POA: Diagnosis not present

## 2020-06-21 DIAGNOSIS — C4021 Malignant neoplasm of long bones of right lower limb: Secondary | ICD-10-CM | POA: Diagnosis not present

## 2020-06-21 DIAGNOSIS — C7802 Secondary malignant neoplasm of left lung: Secondary | ICD-10-CM | POA: Diagnosis not present

## 2020-06-21 DIAGNOSIS — Z5111 Encounter for antineoplastic chemotherapy: Secondary | ICD-10-CM | POA: Diagnosis not present

## 2020-06-21 DIAGNOSIS — Z79899 Other long term (current) drug therapy: Secondary | ICD-10-CM | POA: Diagnosis not present

## 2020-06-21 DIAGNOSIS — Z7689 Persons encountering health services in other specified circumstances: Secondary | ICD-10-CM | POA: Diagnosis not present

## 2020-06-22 DIAGNOSIS — F32A Depression, unspecified: Secondary | ICD-10-CM | POA: Diagnosis not present

## 2020-06-22 DIAGNOSIS — E669 Obesity, unspecified: Secondary | ICD-10-CM | POA: Diagnosis not present

## 2020-06-22 DIAGNOSIS — Z7689 Persons encountering health services in other specified circumstances: Secondary | ICD-10-CM | POA: Diagnosis not present

## 2020-06-22 DIAGNOSIS — Z87891 Personal history of nicotine dependence: Secondary | ICD-10-CM | POA: Diagnosis not present

## 2020-06-22 DIAGNOSIS — C7802 Secondary malignant neoplasm of left lung: Secondary | ICD-10-CM | POA: Diagnosis not present

## 2020-06-22 DIAGNOSIS — C4021 Malignant neoplasm of long bones of right lower limb: Secondary | ICD-10-CM | POA: Diagnosis not present

## 2020-06-22 DIAGNOSIS — F419 Anxiety disorder, unspecified: Secondary | ICD-10-CM | POA: Diagnosis not present

## 2020-06-22 DIAGNOSIS — D649 Anemia, unspecified: Secondary | ICD-10-CM | POA: Diagnosis not present

## 2020-06-22 DIAGNOSIS — Z79899 Other long term (current) drug therapy: Secondary | ICD-10-CM | POA: Diagnosis not present

## 2020-07-15 DIAGNOSIS — Z419 Encounter for procedure for purposes other than remedying health state, unspecified: Secondary | ICD-10-CM | POA: Diagnosis not present

## 2020-08-01 ENCOUNTER — Telehealth: Payer: Self-pay | Admitting: Dermatology

## 2020-08-01 NOTE — Telephone Encounter (Signed)
Patient's mother is calling for a referral appointment, but does not want to wait until November 2022 for appointment.  Patient's mom would like referral sent back to referring provider.

## 2020-08-06 ENCOUNTER — Telehealth: Payer: Self-pay | Admitting: Dermatology

## 2020-08-06 NOTE — Telephone Encounter (Signed)
Pam with Dr. Georgie Chard Day's (?) office left a message on office voice mail saying that she received your message about not taking not taking Medicaid, but she says that [place] has [place] primary and Medicaid secondary, so can he be scheduled?

## 2020-08-06 NOTE — Telephone Encounter (Signed)
I do not have a referral for this patient. °

## 2020-08-06 NOTE — Telephone Encounter (Signed)
Informed Pam that we can see this patient and that new patient appointments are booking into November.

## 2020-08-06 NOTE — Telephone Encounter (Signed)
Yes we can see this patient if he has [employer] as primary insuracne. Referral will be generated in Epic and will contact patient to schedule appointment.

## 2020-08-08 ENCOUNTER — Ambulatory Visit (INDEPENDENT_AMBULATORY_CARE_PROVIDER_SITE_OTHER): Payer: Medicaid Other | Admitting: Podiatry

## 2020-08-08 ENCOUNTER — Other Ambulatory Visit: Payer: Self-pay

## 2020-08-08 ENCOUNTER — Encounter: Payer: Self-pay | Admitting: Podiatry

## 2020-08-08 DIAGNOSIS — M778 Other enthesopathies, not elsewhere classified: Secondary | ICD-10-CM | POA: Diagnosis not present

## 2020-08-08 DIAGNOSIS — M779 Enthesopathy, unspecified: Secondary | ICD-10-CM | POA: Diagnosis not present

## 2020-08-08 DIAGNOSIS — M7751 Other enthesopathy of right foot: Secondary | ICD-10-CM

## 2020-08-08 DIAGNOSIS — M7752 Other enthesopathy of left foot: Secondary | ICD-10-CM | POA: Diagnosis not present

## 2020-08-08 DIAGNOSIS — Z7689 Persons encountering health services in other specified circumstances: Secondary | ICD-10-CM | POA: Diagnosis not present

## 2020-08-08 NOTE — Progress Notes (Signed)
Subjective:   Patient ID: Edwin Cook, male   DOB: 25 y.o.   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   ROS      Objective:  Physical Exam  Neurovascular status intact moderate depression of the arch bilateral with patient found to have inflammation discomfort of the feet bilateral and significant limb length discrepancy with the right leg being 1/2 inch shorter than the left     Assessment:  Patient undergoing difficult chemotherapy now with foot pain inflammation     Plan:  H&P reviewed condition recommended orthotics to lift up the arch and I am billing that with almost half inch lift on the right side.  There remained very deep and a [place] style and patient is encouraged to come in and may require second pair for other types of shoes due to his limb length discrepancy history of Ewing sarcoma and current treatment for advanced lung cancer

## 2020-08-09 DIAGNOSIS — Z7689 Persons encountering health services in other specified circumstances: Secondary | ICD-10-CM | POA: Diagnosis not present

## 2020-08-15 DIAGNOSIS — C4021 Malignant neoplasm of long bones of right lower limb: Secondary | ICD-10-CM | POA: Diagnosis not present

## 2020-08-15 DIAGNOSIS — R269 Unspecified abnormalities of gait and mobility: Secondary | ICD-10-CM | POA: Diagnosis not present

## 2020-08-15 DIAGNOSIS — Z419 Encounter for procedure for purposes other than remedying health state, unspecified: Secondary | ICD-10-CM | POA: Diagnosis not present

## 2020-08-15 DIAGNOSIS — Z7689 Persons encountering health services in other specified circumstances: Secondary | ICD-10-CM | POA: Diagnosis not present

## 2020-08-15 DIAGNOSIS — C7802 Secondary malignant neoplasm of left lung: Secondary | ICD-10-CM | POA: Diagnosis not present

## 2020-08-15 DIAGNOSIS — Z7409 Other reduced mobility: Secondary | ICD-10-CM | POA: Diagnosis not present

## 2020-08-15 DIAGNOSIS — R29898 Other symptoms and signs involving the musculoskeletal system: Secondary | ICD-10-CM | POA: Diagnosis not present

## 2020-08-15 DIAGNOSIS — I2699 Other pulmonary embolism without acute cor pulmonale: Secondary | ICD-10-CM | POA: Diagnosis not present

## 2020-08-24 DIAGNOSIS — I2699 Other pulmonary embolism without acute cor pulmonale: Secondary | ICD-10-CM | POA: Diagnosis not present

## 2020-08-24 DIAGNOSIS — C4021 Malignant neoplasm of long bones of right lower limb: Secondary | ICD-10-CM | POA: Diagnosis not present

## 2020-08-24 DIAGNOSIS — C7802 Secondary malignant neoplasm of left lung: Secondary | ICD-10-CM | POA: Diagnosis not present

## 2020-08-24 DIAGNOSIS — R29898 Other symptoms and signs involving the musculoskeletal system: Secondary | ICD-10-CM | POA: Diagnosis not present

## 2020-08-24 DIAGNOSIS — Z7409 Other reduced mobility: Secondary | ICD-10-CM | POA: Diagnosis not present

## 2020-08-24 DIAGNOSIS — R269 Unspecified abnormalities of gait and mobility: Secondary | ICD-10-CM | POA: Diagnosis not present

## 2020-08-24 DIAGNOSIS — Z7689 Persons encountering health services in other specified circumstances: Secondary | ICD-10-CM | POA: Diagnosis not present

## 2020-08-27 ENCOUNTER — Telehealth: Payer: Self-pay | Admitting: Podiatry

## 2020-08-27 DIAGNOSIS — Z7689 Persons encountering health services in other specified circumstances: Secondary | ICD-10-CM | POA: Diagnosis not present

## 2020-08-27 NOTE — Telephone Encounter (Signed)
Orthotics in... lvm for pt ok to pick up.Marland KitchenMarland Kitchen

## 2020-08-28 DIAGNOSIS — Z7689 Persons encountering health services in other specified circumstances: Secondary | ICD-10-CM | POA: Diagnosis not present

## 2020-08-30 ENCOUNTER — Telehealth: Payer: Self-pay | Admitting: Podiatry

## 2020-08-30 DIAGNOSIS — Z7689 Persons encountering health services in other specified circumstances: Secondary | ICD-10-CM | POA: Diagnosis not present

## 2020-08-30 DIAGNOSIS — Z7409 Other reduced mobility: Secondary | ICD-10-CM | POA: Diagnosis not present

## 2020-08-30 DIAGNOSIS — R29898 Other symptoms and signs involving the musculoskeletal system: Secondary | ICD-10-CM | POA: Diagnosis not present

## 2020-08-30 DIAGNOSIS — C7802 Secondary malignant neoplasm of left lung: Secondary | ICD-10-CM | POA: Diagnosis not present

## 2020-08-30 DIAGNOSIS — I2699 Other pulmonary embolism without acute cor pulmonale: Secondary | ICD-10-CM | POA: Diagnosis not present

## 2020-08-30 DIAGNOSIS — R269 Unspecified abnormalities of gait and mobility: Secondary | ICD-10-CM | POA: Diagnosis not present

## 2020-08-30 DIAGNOSIS — C4021 Malignant neoplasm of long bones of right lower limb: Secondary | ICD-10-CM | POA: Diagnosis not present

## 2020-08-30 NOTE — Telephone Encounter (Signed)
Pts mom called stating pt told her he got a call that his orthotics are ready. I told her they were and they could pick them up. She is going to try to come Monday around 11 to pick them up... She then mentioned a second pr that Dr Paulla Dolly was talking to them about and I explained that we would like pt to try these first and make sure they feel good and then if they feel good to call and I can get another pair ordered with different code per Dr Paulla Dolly. She said that sounded good and they will pick up Monday.

## 2020-09-03 DIAGNOSIS — Z7689 Persons encountering health services in other specified circumstances: Secondary | ICD-10-CM | POA: Diagnosis not present

## 2020-09-04 DIAGNOSIS — R269 Unspecified abnormalities of gait and mobility: Secondary | ICD-10-CM | POA: Diagnosis not present

## 2020-09-04 DIAGNOSIS — Z7689 Persons encountering health services in other specified circumstances: Secondary | ICD-10-CM | POA: Diagnosis not present

## 2020-09-04 DIAGNOSIS — R29898 Other symptoms and signs involving the musculoskeletal system: Secondary | ICD-10-CM | POA: Diagnosis not present

## 2020-09-04 DIAGNOSIS — C7802 Secondary malignant neoplasm of left lung: Secondary | ICD-10-CM | POA: Diagnosis not present

## 2020-09-04 DIAGNOSIS — Z7409 Other reduced mobility: Secondary | ICD-10-CM | POA: Diagnosis not present

## 2020-09-04 DIAGNOSIS — I2699 Other pulmonary embolism without acute cor pulmonale: Secondary | ICD-10-CM | POA: Diagnosis not present

## 2020-09-04 DIAGNOSIS — C4021 Malignant neoplasm of long bones of right lower limb: Secondary | ICD-10-CM | POA: Diagnosis not present

## 2020-09-05 ENCOUNTER — Telehealth: Payer: Self-pay | Admitting: Podiatry

## 2020-09-05 NOTE — Telephone Encounter (Signed)
Pts mom called and said the orthotics they picked up are not correct. The right was to have a lift on it and it does not.   I asked pts mom to have pt send me a picture of his current ones and also looked up the old ones and the right one had a 5/16th inch lift and I told her to bring the right one back and I will get it sent back to get the lift added. She is going to drop off when she can and I will send it back to [place].

## 2020-09-10 DIAGNOSIS — Z7689 Persons encountering health services in other specified circumstances: Secondary | ICD-10-CM | POA: Diagnosis not present

## 2020-09-11 DIAGNOSIS — F419 Anxiety disorder, unspecified: Secondary | ICD-10-CM | POA: Diagnosis not present

## 2020-09-11 DIAGNOSIS — F324 Major depressive disorder, single episode, in partial remission: Secondary | ICD-10-CM | POA: Diagnosis not present

## 2020-09-11 DIAGNOSIS — C7802 Secondary malignant neoplasm of left lung: Secondary | ICD-10-CM | POA: Diagnosis not present

## 2020-09-11 DIAGNOSIS — Z7689 Persons encountering health services in other specified circumstances: Secondary | ICD-10-CM | POA: Diagnosis not present

## 2020-09-11 NOTE — Telephone Encounter (Signed)
Pt and mom came in to drop off orthotic for adjustment and I took only the right one at first. She then came back in after pt put the left one in his shoe and said it was not right. It appears that both orthotics have the lift on it and it only needs to be on the right one. I took the left orthotic and will send back to [place] to have the lift removed.

## 2020-09-13 DIAGNOSIS — R269 Unspecified abnormalities of gait and mobility: Secondary | ICD-10-CM | POA: Diagnosis not present

## 2020-09-13 DIAGNOSIS — C7802 Secondary malignant neoplasm of left lung: Secondary | ICD-10-CM | POA: Diagnosis not present

## 2020-09-13 DIAGNOSIS — I2699 Other pulmonary embolism without acute cor pulmonale: Secondary | ICD-10-CM | POA: Diagnosis not present

## 2020-09-13 DIAGNOSIS — C4021 Malignant neoplasm of long bones of right lower limb: Secondary | ICD-10-CM | POA: Diagnosis not present

## 2020-09-13 DIAGNOSIS — Z7689 Persons encountering health services in other specified circumstances: Secondary | ICD-10-CM | POA: Diagnosis not present

## 2020-09-13 DIAGNOSIS — Z7409 Other reduced mobility: Secondary | ICD-10-CM | POA: Diagnosis not present

## 2020-09-13 DIAGNOSIS — R29898 Other symptoms and signs involving the musculoskeletal system: Secondary | ICD-10-CM | POA: Diagnosis not present

## 2020-09-14 DIAGNOSIS — Z419 Encounter for procedure for purposes other than remedying health state, unspecified: Secondary | ICD-10-CM | POA: Diagnosis not present

## 2020-09-25 ENCOUNTER — Telehealth: Payer: Self-pay | Admitting: Podiatry

## 2020-09-25 DIAGNOSIS — Z7689 Persons encountering health services in other specified circumstances: Secondary | ICD-10-CM | POA: Diagnosis not present

## 2020-09-25 NOTE — Telephone Encounter (Signed)
Pts mom called to see if the orthotic was back in.. I checked my box from when I was out and it is back. Pts mom aware ok to pick up.

## 2020-09-28 DIAGNOSIS — Z7689 Persons encountering health services in other specified circumstances: Secondary | ICD-10-CM | POA: Diagnosis not present

## 2020-10-01 DIAGNOSIS — Z7689 Persons encountering health services in other specified circumstances: Secondary | ICD-10-CM | POA: Diagnosis not present

## 2020-10-04 DIAGNOSIS — Z7689 Persons encountering health services in other specified circumstances: Secondary | ICD-10-CM | POA: Diagnosis not present

## 2020-10-10 ENCOUNTER — Telehealth: Payer: Self-pay | Admitting: Podiatry

## 2020-10-10 DIAGNOSIS — M779 Enthesopathy, unspecified: Secondary | ICD-10-CM | POA: Diagnosis not present

## 2020-10-10 NOTE — Telephone Encounter (Signed)
Pts mom called and pt is wanting to get the second pair of orthotics ordered. They picked up the corrected left one a few weeks ago and they are working good for pt. Pts mom stated pts insurance plan renews on 8.1 so if we could get them ordered asap. I have put charges in for them as of today and will fax order to [place] as well. I told her I would call when they come in so they can pick them up.

## 2020-10-15 DIAGNOSIS — Z419 Encounter for procedure for purposes other than remedying health state, unspecified: Secondary | ICD-10-CM | POA: Diagnosis not present

## 2020-10-16 DIAGNOSIS — Z7689 Persons encountering health services in other specified circumstances: Secondary | ICD-10-CM | POA: Diagnosis not present

## 2020-10-18 DIAGNOSIS — Z7689 Persons encountering health services in other specified circumstances: Secondary | ICD-10-CM | POA: Diagnosis not present

## 2020-10-22 ENCOUNTER — Telehealth: Payer: Self-pay | Admitting: Podiatry

## 2020-10-22 NOTE — Telephone Encounter (Signed)
Pts mom left message checking to see if pts 2nd pr of orthotics were in.  Returned call upon checking the box and they are in. Pts mom aware ok to pick up.

## 2020-10-23 DIAGNOSIS — Z7689 Persons encountering health services in other specified circumstances: Secondary | ICD-10-CM | POA: Diagnosis not present

## 2020-10-25 DIAGNOSIS — Z7689 Persons encountering health services in other specified circumstances: Secondary | ICD-10-CM | POA: Diagnosis not present

## 2020-10-26 DIAGNOSIS — Z7689 Persons encountering health services in other specified circumstances: Secondary | ICD-10-CM | POA: Diagnosis not present

## 2020-10-29 ENCOUNTER — Telehealth: Payer: Self-pay | Admitting: Podiatry

## 2020-10-29 NOTE — Telephone Encounter (Signed)
Pts mom picked up the orthotics and they are not correct. They are the wrong size and type of orthotic, pts age and weight were also not correct  Pts mom tested me a copy of the original form and the current form that was with the orthotics and is to bring the orthotics back so I can send them back to get corrected

## 2020-10-30 DIAGNOSIS — Z7689 Persons encountering health services in other specified circumstances: Secondary | ICD-10-CM | POA: Diagnosis not present

## 2020-11-02 DIAGNOSIS — Z7689 Persons encountering health services in other specified circumstances: Secondary | ICD-10-CM | POA: Diagnosis not present

## 2020-11-06 ENCOUNTER — Telehealth: Payer: Self-pay | Admitting: Podiatry

## 2020-11-06 NOTE — Telephone Encounter (Signed)
Called [place] lab as the orthotics pt received this time were not the same as what was ordered in June 2022.They were to be a duplicate order. She told me to not send them back that she is just going to remake them and will submit it today.

## 2020-11-13 ENCOUNTER — Telehealth: Payer: Self-pay | Admitting: Podiatry

## 2020-11-13 DIAGNOSIS — Z7689 Persons encountering health services in other specified circumstances: Secondary | ICD-10-CM | POA: Diagnosis not present

## 2020-11-13 NOTE — Telephone Encounter (Signed)
Corrected orthotics in... lvm for pt ok to pick up.Marland Kitchen

## 2020-11-15 DIAGNOSIS — Z419 Encounter for procedure for purposes other than remedying health state, unspecified: Secondary | ICD-10-CM | POA: Diagnosis not present

## 2020-11-16 DIAGNOSIS — Z7689 Persons encountering health services in other specified circumstances: Secondary | ICD-10-CM | POA: Diagnosis not present

## 2020-11-22 ENCOUNTER — Telehealth: Payer: Self-pay | Admitting: Podiatry

## 2020-11-22 DIAGNOSIS — Z7689 Persons encountering health services in other specified circumstances: Secondary | ICD-10-CM | POA: Diagnosis not present

## 2020-11-22 NOTE — Telephone Encounter (Signed)
Pts mom left message wanting a call back to answer a question.   I returned call and pts mom asking why they got a bill from [facility] for 850.00 for the original pair of orthotics. Pt has met his out of pocket max.  Please call pts mom to discuss.

## 2020-11-26 DIAGNOSIS — Z7689 Persons encountering health services in other specified circumstances: Secondary | ICD-10-CM | POA: Diagnosis not present

## 2020-11-30 DIAGNOSIS — Z7689 Persons encountering health services in other specified circumstances: Secondary | ICD-10-CM | POA: Diagnosis not present

## 2020-12-03 DIAGNOSIS — Z7689 Persons encountering health services in other specified circumstances: Secondary | ICD-10-CM | POA: Diagnosis not present

## 2020-12-07 DIAGNOSIS — Z7689 Persons encountering health services in other specified circumstances: Secondary | ICD-10-CM | POA: Diagnosis not present

## 2020-12-10 DIAGNOSIS — Z7689 Persons encountering health services in other specified circumstances: Secondary | ICD-10-CM | POA: Diagnosis not present

## 2020-12-11 DIAGNOSIS — Z7689 Persons encountering health services in other specified circumstances: Secondary | ICD-10-CM | POA: Diagnosis not present

## 2020-12-12 DIAGNOSIS — Z7689 Persons encountering health services in other specified circumstances: Secondary | ICD-10-CM | POA: Diagnosis not present

## 2020-12-15 DIAGNOSIS — Z419 Encounter for procedure for purposes other than remedying health state, unspecified: Secondary | ICD-10-CM | POA: Diagnosis not present

## 2020-12-19 DIAGNOSIS — Z7689 Persons encountering health services in other specified circumstances: Secondary | ICD-10-CM | POA: Diagnosis not present

## 2020-12-24 DIAGNOSIS — Z7689 Persons encountering health services in other specified circumstances: Secondary | ICD-10-CM | POA: Diagnosis not present

## 2020-12-27 DIAGNOSIS — Z7689 Persons encountering health services in other specified circumstances: Secondary | ICD-10-CM | POA: Diagnosis not present

## 2021-01-15 DIAGNOSIS — Z419 Encounter for procedure for purposes other than remedying health state, unspecified: Secondary | ICD-10-CM | POA: Diagnosis not present

## 2021-01-18 DIAGNOSIS — Z7689 Persons encountering health services in other specified circumstances: Secondary | ICD-10-CM | POA: Diagnosis not present

## 2021-02-05 DIAGNOSIS — Z7689 Persons encountering health services in other specified circumstances: Secondary | ICD-10-CM | POA: Diagnosis not present

## 2021-02-14 DIAGNOSIS — Z419 Encounter for procedure for purposes other than remedying health state, unspecified: Secondary | ICD-10-CM | POA: Diagnosis not present

## 2021-02-15 DIAGNOSIS — Z7689 Persons encountering health services in other specified circumstances: Secondary | ICD-10-CM | POA: Diagnosis not present

## 2021-02-25 DIAGNOSIS — Z7689 Persons encountering health services in other specified circumstances: Secondary | ICD-10-CM | POA: Diagnosis not present

## 2021-02-26 DIAGNOSIS — Z7689 Persons encountering health services in other specified circumstances: Secondary | ICD-10-CM | POA: Diagnosis not present

## 2021-03-01 DIAGNOSIS — Z7689 Persons encountering health services in other specified circumstances: Secondary | ICD-10-CM | POA: Diagnosis not present

## 2021-03-14 DIAGNOSIS — Z7689 Persons encountering health services in other specified circumstances: Secondary | ICD-10-CM | POA: Diagnosis not present

## 2021-03-17 DIAGNOSIS — Z419 Encounter for procedure for purposes other than remedying health state, unspecified: Secondary | ICD-10-CM | POA: Diagnosis not present

## 2021-03-29 DIAGNOSIS — Z7689 Persons encountering health services in other specified circumstances: Secondary | ICD-10-CM | POA: Diagnosis not present

## 2021-04-02 DIAGNOSIS — Z7689 Persons encountering health services in other specified circumstances: Secondary | ICD-10-CM | POA: Diagnosis not present

## 2021-04-06 DIAGNOSIS — Z7689 Persons encountering health services in other specified circumstances: Secondary | ICD-10-CM | POA: Diagnosis not present

## 2021-04-09 DIAGNOSIS — Z7689 Persons encountering health services in other specified circumstances: Secondary | ICD-10-CM | POA: Diagnosis not present

## 2021-04-10 DIAGNOSIS — Z7689 Persons encountering health services in other specified circumstances: Secondary | ICD-10-CM | POA: Diagnosis not present

## 2021-04-11 DIAGNOSIS — Z7689 Persons encountering health services in other specified circumstances: Secondary | ICD-10-CM | POA: Diagnosis not present

## 2021-04-16 DIAGNOSIS — Z7689 Persons encountering health services in other specified circumstances: Secondary | ICD-10-CM | POA: Diagnosis not present

## 2021-04-17 DIAGNOSIS — Z7689 Persons encountering health services in other specified circumstances: Secondary | ICD-10-CM | POA: Diagnosis not present

## 2021-04-17 DIAGNOSIS — Z419 Encounter for procedure for purposes other than remedying health state, unspecified: Secondary | ICD-10-CM | POA: Diagnosis not present

## 2021-04-18 DIAGNOSIS — Z7689 Persons encountering health services in other specified circumstances: Secondary | ICD-10-CM | POA: Diagnosis not present

## 2021-04-25 DIAGNOSIS — Z7689 Persons encountering health services in other specified circumstances: Secondary | ICD-10-CM | POA: Diagnosis not present

## 2021-04-29 DIAGNOSIS — Z7689 Persons encountering health services in other specified circumstances: Secondary | ICD-10-CM | POA: Diagnosis not present

## 2021-05-07 DIAGNOSIS — Z7689 Persons encountering health services in other specified circumstances: Secondary | ICD-10-CM | POA: Diagnosis not present

## 2021-05-15 DIAGNOSIS — Z419 Encounter for procedure for purposes other than remedying health state, unspecified: Secondary | ICD-10-CM | POA: Diagnosis not present

## 2021-05-20 DIAGNOSIS — Z7689 Persons encountering health services in other specified circumstances: Secondary | ICD-10-CM | POA: Diagnosis not present

## 2021-05-27 DIAGNOSIS — Z7689 Persons encountering health services in other specified circumstances: Secondary | ICD-10-CM | POA: Diagnosis not present

## 2021-06-04 DIAGNOSIS — Z7689 Persons encountering health services in other specified circumstances: Secondary | ICD-10-CM | POA: Diagnosis not present

## 2021-06-06 DIAGNOSIS — Z7689 Persons encountering health services in other specified circumstances: Secondary | ICD-10-CM | POA: Diagnosis not present

## 2021-06-10 DIAGNOSIS — Z7689 Persons encountering health services in other specified circumstances: Secondary | ICD-10-CM | POA: Diagnosis not present

## 2021-06-11 DIAGNOSIS — Z7689 Persons encountering health services in other specified circumstances: Secondary | ICD-10-CM | POA: Diagnosis not present

## 2021-06-12 DIAGNOSIS — Z7689 Persons encountering health services in other specified circumstances: Secondary | ICD-10-CM | POA: Diagnosis not present

## 2021-06-13 DIAGNOSIS — Z7689 Persons encountering health services in other specified circumstances: Secondary | ICD-10-CM | POA: Diagnosis not present

## 2021-06-14 DIAGNOSIS — Z7689 Persons encountering health services in other specified circumstances: Secondary | ICD-10-CM | POA: Diagnosis not present

## 2021-06-15 DIAGNOSIS — Z419 Encounter for procedure for purposes other than remedying health state, unspecified: Secondary | ICD-10-CM | POA: Diagnosis not present

## 2021-07-01 DIAGNOSIS — Z7689 Persons encountering health services in other specified circumstances: Secondary | ICD-10-CM | POA: Diagnosis not present

## 2021-07-02 DIAGNOSIS — Z7689 Persons encountering health services in other specified circumstances: Secondary | ICD-10-CM | POA: Diagnosis not present

## 2021-07-03 DIAGNOSIS — Z7689 Persons encountering health services in other specified circumstances: Secondary | ICD-10-CM | POA: Diagnosis not present

## 2021-07-04 DIAGNOSIS — Z7689 Persons encountering health services in other specified circumstances: Secondary | ICD-10-CM | POA: Diagnosis not present

## 2021-07-05 DIAGNOSIS — Z7689 Persons encountering health services in other specified circumstances: Secondary | ICD-10-CM | POA: Diagnosis not present

## 2021-07-11 DIAGNOSIS — Z7689 Persons encountering health services in other specified circumstances: Secondary | ICD-10-CM | POA: Diagnosis not present

## 2021-07-15 DIAGNOSIS — Z419 Encounter for procedure for purposes other than remedying health state, unspecified: Secondary | ICD-10-CM | POA: Diagnosis not present

## 2021-07-22 DIAGNOSIS — Z7689 Persons encountering health services in other specified circumstances: Secondary | ICD-10-CM | POA: Diagnosis not present

## 2021-07-23 DIAGNOSIS — Z7689 Persons encountering health services in other specified circumstances: Secondary | ICD-10-CM | POA: Diagnosis not present

## 2021-07-24 DIAGNOSIS — Z7689 Persons encountering health services in other specified circumstances: Secondary | ICD-10-CM | POA: Diagnosis not present

## 2021-07-25 DIAGNOSIS — Z7689 Persons encountering health services in other specified circumstances: Secondary | ICD-10-CM | POA: Diagnosis not present

## 2021-07-26 DIAGNOSIS — Z7689 Persons encountering health services in other specified circumstances: Secondary | ICD-10-CM | POA: Diagnosis not present

## 2021-08-13 DIAGNOSIS — Z7689 Persons encountering health services in other specified circumstances: Secondary | ICD-10-CM | POA: Diagnosis not present

## 2021-08-14 DIAGNOSIS — Z7689 Persons encountering health services in other specified circumstances: Secondary | ICD-10-CM | POA: Diagnosis not present

## 2021-08-15 DIAGNOSIS — Z7689 Persons encountering health services in other specified circumstances: Secondary | ICD-10-CM | POA: Diagnosis not present

## 2021-08-15 DIAGNOSIS — Z419 Encounter for procedure for purposes other than remedying health state, unspecified: Secondary | ICD-10-CM | POA: Diagnosis not present

## 2021-08-16 DIAGNOSIS — Z7689 Persons encountering health services in other specified circumstances: Secondary | ICD-10-CM | POA: Diagnosis not present

## 2021-08-23 ENCOUNTER — Ambulatory Visit (HOSPITAL_COMMUNITY)
Admission: EM | Admit: 2021-08-23 | Discharge: 2021-08-23 | Disposition: A | Discharge status: Home / Self Care | Payer: BC Managed Care – PPO | Attending: Physician Assistant | Admitting: Physician Assistant

## 2021-08-23 ENCOUNTER — Ambulatory Visit (HOSPITAL_COMMUNITY): Payer: Self-pay

## 2021-08-23 ENCOUNTER — Inpatient Hospital Stay (HOSPITAL_COMMUNITY)
Admission: RE | Admit: 2021-08-23 | Discharge: 2021-08-23 | Disposition: A | Discharge status: Home / Self Care | Payer: Self-pay | Source: Ambulatory Visit

## 2021-08-23 ENCOUNTER — Encounter (HOSPITAL_COMMUNITY): Payer: Self-pay | Admitting: Emergency Medicine

## 2021-08-23 DIAGNOSIS — J0100 Acute maxillary sinusitis, unspecified: Secondary | ICD-10-CM | POA: Diagnosis not present

## 2021-08-23 DIAGNOSIS — R051 Acute cough: Secondary | ICD-10-CM | POA: Diagnosis not present

## 2021-08-23 DIAGNOSIS — H6502 Acute serous otitis media, left ear: Secondary | ICD-10-CM

## 2021-08-23 DIAGNOSIS — Z7689 Persons encountering health services in other specified circumstances: Secondary | ICD-10-CM | POA: Diagnosis not present

## 2021-08-23 DIAGNOSIS — J069 Acute upper respiratory infection, unspecified: Secondary | ICD-10-CM | POA: Diagnosis not present

## 2021-08-23 DIAGNOSIS — J40 Bronchitis, not specified as acute or chronic: Secondary | ICD-10-CM

## 2021-08-23 MED ORDER — FLUTICASONE PROPIONATE 50 MCG/ACT NA SUSP: 2.0000 | Freq: Every day | NASAL | 0 refills | Status: DC

## 2021-08-23 MED ORDER — AMOXICILLIN 500 MG PO CAPS: 500.0000 mg | ORAL_CAPSULE | Freq: Three times a day (TID) | ORAL | 0 refills | Status: DC

## 2021-08-23 MED ORDER — DM-GUAIFENESIN ER 30-600 MG PO TB12: 1.0000 | ORAL_TABLET | Freq: Two times a day (BID) | ORAL | 0 refills | Status: DC

## 2021-08-23 NOTE — ED Provider Notes (Signed)
[facility]    CSN: [id number] Arrival date & time: 08/23/21  0263      History   Chief Complaint Chief Complaint  Patient presents with   Cough   Eye Pain    HPI Edwin Cook is a 26 y.o. male.   61-year-old male presents with cough, sinus headache, congestion.  Patient is currently undergoing chemotherapy for lung cancer.  Patient indicates for the past week he has been having persistent cough, chest congestion, purulent production.  No wheezing or shortness of breath present.  Patient relates he is having coughing fits to where he will cough forcefully for period of time before he being able to stop.  Patient relates he has had mild fever at home, no chills or sweats.  Patient also relates that he is having upper respiratory symptoms with considerable sinus congestion, pressure, intermittent headache of the left side frontal and around behind the eye.  Patient relates that the sinus congestion is causing his headache and the eye pressure.  Patient denies any vision changes, but is having considerable nasal congestion with rhinitis.  Patient also has bilateral ear pressure, but no pain at the present time.  Patient is taking DayQuil with minimal improvement, and he did take some Tessalon [place] for the cough but this is not helped.  No nausea or vomiting.   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 Authorizing Provider  amoxicillin (AMOXIL) 500 MG capsule Take 1 capsule (500 mg total) by mouth 3 (three) times daily. 08/23/21  Yes Nyoka Lint, PA-C  dextromethorphan-guaiFENesin [facility] DM) 30-600 MG 12hr tablet Take 1 tablet by mouth 2 (two) times daily. 08/23/21  Yes Nyoka Lint, PA-C  fluticasone [facility]) 50 MCG/ACT nasal spray Place 2 sprays into both nostrils daily. 08/23/21  Yes Nyoka Lint, PA-C    Family History Family History  Problem Relation Age of Onset   Diabetes Mother    Hypertension Mother    Thyroid disease Mother    Asthma Father     Social History Social History   Tobacco Use   Smoking status: Some Days    Types: E-cigarettes   Smokeless tobacco: Never   Tobacco comments:    vapes occasionally, usually with drinking  Substance Use Topics   Alcohol use: Yes  Alcohol/week: 0.0 standard drinks of alcohol    Comment: socially   Drug use: No     Allergies   Morphine and related   Review of Systems Review of Systems  HENT:  Positive for rhinorrhea, sinus pressure and sinus pain.   Eyes:  Positive for pain (sinus pressure behing eye).  Respiratory:  Positive for cough.      Physical Exam Triage Vital Signs ED Triage Vitals  Enc Vitals Group     BP 08/23/21 0957 107/75     Pulse Rate 08/23/21 0957 (!) 110     Resp 08/23/21 0957 20     Temp 08/23/21 0957 99 F (37.2 C)     Temp Source 08/23/21 0957 Oral     SpO2  08/23/21 0957 94 %     Weight --      Height --      Head Circumference --      Peak Flow --      Pain Score 08/23/21 0955 6     Pain Loc --      Pain Edu? --      Excl. in [place]? --    No data found.  Updated Vital Signs BP 107/75 (BP Location: Right Arm)   Pulse (!) 110   Temp 99 F (37.2 C) (Oral)   Resp 20   SpO2 94%   Visual Acuity Right Eye Distance:   Left Eye Distance:   Bilateral Distance:    Right Eye Near:   Left Eye Near:    Bilateral Near:     Physical Exam Constitutional:      Appearance: Normal appearance.  HENT:     Right Ear: Tympanic membrane and ear canal normal.     Left Ear: Ear canal normal. Tympanic membrane is injected and erythematous.     Mouth/Throat:     Mouth: Mucous membranes are moist.     Pharynx: Oropharynx is clear. No pharyngeal swelling.  Cardiovascular:     Rate and Rhythm: Normal rate and regular rhythm.     Heart sounds: Normal heart sounds.  Pulmonary:     Effort: Pulmonary effort is normal.     Breath sounds: Normal air entry. Examination of the right-upper field reveals rhonchi. Examination of the left-upper field reveals rhonchi. Rhonchi present. No wheezing or rales.  Lymphadenopathy:     Cervical: No cervical adenopathy.  Neurological:     Mental Status: He is alert.      UC Treatments / Results  Labs (all labs ordered are listed, but only abnormal results are displayed) Labs Reviewed - No data to display  EKG   Radiology No results found.  Procedures Procedures (including critical care time)  Medications Ordered in UC Medications - No data to display  Initial Impression / Assessment and Plan / UC Course  I have reviewed the triage vital signs and the nursing notes.  Pertinent labs & imaging results that were available during my care of the patient were reviewed by me and considered in my medical decision making (see chart for details).    Plan: 1.  Advised to use the Flonase nasal spray 2 sprays  each nostril once a day to help decrease the sinus congestion and pressure. 2.  Advised to take the amoxicillin 500 mg 1 every 8 hours for 10 days to help address the sinus infection, left ear infection. 3.  Advised to take Mucinex DM to help control the cough and to help decrease the bronchial congestion.  4.  Advised to take Tylenol as needed for discomfort. 5.  Advised follow-up PCP or return to urgent care if symptoms fail to improve in the next 5 to 7 days, if so then consider chest x-ray and CBC. Final Clinical Impressions(s) / UC Diagnoses   Final diagnoses:  Acute cough  Bronchitis  Viral upper respiratory tract infection  Acute non-recurrent maxillary sinusitis  Non-recurrent acute serous otitis media of left ear     Discharge Instructions      Advised to increase fluid intake over the next couple days. Use the Flonase nasal spray 2 sprays each nostril once a day to help decrease the sinus congestion. Advised to take the Mucinex DM every 12 hours to help control the cough and to loosen the congestion. Advised to take the Amoxil 500 mg 1 every 8 hours to treat the sinus and the ear infection. Advised to avoid any cold medicines that contain dextromethorphan since this product is already in the Mucinex DM. Advised to follow-up PCP or return to urgent care if symptoms fail to improve.    ED Prescriptions     Medication Sig Dispense Auth. Provider   dextromethorphan-guaiFENesin (MUCINEX DM) 30-600 MG 12hr tablet Take 1 tablet by mouth 2 (two) times daily. 20 tablet Nyoka Lint, PA-C   amoxicillin (AMOXIL) 500 MG capsule Take 1 capsule (500 mg total) by mouth 3 (three) times daily. 30 capsule Nyoka Lint, PA-C   fluticasone [facility]) 50 MCG/ACT nasal spray Place 2 sprays into both nostrils daily. 9.9 mL Nyoka Lint, PA-C      PDMP not reviewed this encounter.   Nyoka Lint, PA-C 08/23/21 1043

## 2021-08-23 NOTE — ED Triage Notes (Signed)
Pt reports a deep cough x 2 weeks.  States went to oncologist appointment on 5/30 and was given tessalon with no relief.  Also reports constant pressure around left eye that worsens with coughing.  Hx of Cancer in left lung, currently undergoing chemo.

## 2021-08-23 NOTE — Discharge Instructions (Signed)
Advised to increase fluid intake over the next couple days. Use the Flonase nasal spray 2 sprays each nostril once a day to help decrease the sinus congestion. Advised to take the Mucinex DM every 12 hours to help control the cough and to loosen the congestion. Advised to take the Amoxil 500 mg 1 every 8 hours to treat the sinus and the ear infection. Advised to avoid any cold medicines that contain dextromethorphan since this product is already in the Mucinex DM. Advised to follow-up PCP or return to urgent care if symptoms fail to improve.

## 2021-09-02 DIAGNOSIS — Z7689 Persons encountering health services in other specified circumstances: Secondary | ICD-10-CM | POA: Diagnosis not present

## 2021-09-03 DIAGNOSIS — Z7689 Persons encountering health services in other specified circumstances: Secondary | ICD-10-CM | POA: Diagnosis not present

## 2021-09-04 DIAGNOSIS — Z7689 Persons encountering health services in other specified circumstances: Secondary | ICD-10-CM | POA: Diagnosis not present

## 2021-09-05 DIAGNOSIS — Z7689 Persons encountering health services in other specified circumstances: Secondary | ICD-10-CM | POA: Diagnosis not present

## 2021-09-06 DIAGNOSIS — Z7689 Persons encountering health services in other specified circumstances: Secondary | ICD-10-CM | POA: Diagnosis not present

## 2021-09-09 DIAGNOSIS — Z7689 Persons encountering health services in other specified circumstances: Secondary | ICD-10-CM | POA: Diagnosis not present

## 2021-09-14 DIAGNOSIS — Z419 Encounter for procedure for purposes other than remedying health state, unspecified: Secondary | ICD-10-CM | POA: Diagnosis not present

## 2021-09-20 DIAGNOSIS — Z7689 Persons encountering health services in other specified circumstances: Secondary | ICD-10-CM | POA: Diagnosis not present

## 2021-09-24 DIAGNOSIS — Z7689 Persons encountering health services in other specified circumstances: Secondary | ICD-10-CM | POA: Diagnosis not present

## 2021-09-25 DIAGNOSIS — Z7689 Persons encountering health services in other specified circumstances: Secondary | ICD-10-CM | POA: Diagnosis not present

## 2021-09-26 DIAGNOSIS — Z7689 Persons encountering health services in other specified circumstances: Secondary | ICD-10-CM | POA: Diagnosis not present

## 2021-09-27 DIAGNOSIS — Z7689 Persons encountering health services in other specified circumstances: Secondary | ICD-10-CM | POA: Diagnosis not present

## 2021-10-07 DIAGNOSIS — Z7689 Persons encountering health services in other specified circumstances: Secondary | ICD-10-CM | POA: Diagnosis not present

## 2021-10-08 DIAGNOSIS — Z7689 Persons encountering health services in other specified circumstances: Secondary | ICD-10-CM | POA: Diagnosis not present

## 2021-10-11 DIAGNOSIS — Z7689 Persons encountering health services in other specified circumstances: Secondary | ICD-10-CM | POA: Diagnosis not present

## 2021-10-14 DIAGNOSIS — Z7689 Persons encountering health services in other specified circumstances: Secondary | ICD-10-CM | POA: Diagnosis not present

## 2021-10-15 DIAGNOSIS — Z419 Encounter for procedure for purposes other than remedying health state, unspecified: Secondary | ICD-10-CM | POA: Diagnosis not present

## 2021-10-15 DIAGNOSIS — Z7689 Persons encountering health services in other specified circumstances: Secondary | ICD-10-CM | POA: Diagnosis not present

## 2021-10-16 DIAGNOSIS — Z7689 Persons encountering health services in other specified circumstances: Secondary | ICD-10-CM | POA: Diagnosis not present

## 2021-10-17 DIAGNOSIS — Z7689 Persons encountering health services in other specified circumstances: Secondary | ICD-10-CM | POA: Diagnosis not present

## 2021-10-18 DIAGNOSIS — Z7689 Persons encountering health services in other specified circumstances: Secondary | ICD-10-CM | POA: Diagnosis not present

## 2021-10-28 DIAGNOSIS — Z7689 Persons encountering health services in other specified circumstances: Secondary | ICD-10-CM | POA: Diagnosis not present

## 2021-11-05 DIAGNOSIS — Z7689 Persons encountering health services in other specified circumstances: Secondary | ICD-10-CM | POA: Diagnosis not present

## 2021-11-06 DIAGNOSIS — Z7689 Persons encountering health services in other specified circumstances: Secondary | ICD-10-CM | POA: Diagnosis not present

## 2021-11-07 DIAGNOSIS — Z7689 Persons encountering health services in other specified circumstances: Secondary | ICD-10-CM | POA: Diagnosis not present

## 2021-11-08 DIAGNOSIS — Z7689 Persons encountering health services in other specified circumstances: Secondary | ICD-10-CM | POA: Diagnosis not present

## 2021-11-15 DIAGNOSIS — Z419 Encounter for procedure for purposes other than remedying health state, unspecified: Secondary | ICD-10-CM | POA: Diagnosis not present

## 2021-11-22 DIAGNOSIS — Z7689 Persons encountering health services in other specified circumstances: Secondary | ICD-10-CM | POA: Diagnosis not present

## 2021-11-25 DIAGNOSIS — Z7689 Persons encountering health services in other specified circumstances: Secondary | ICD-10-CM | POA: Diagnosis not present

## 2021-11-26 DIAGNOSIS — Z7689 Persons encountering health services in other specified circumstances: Secondary | ICD-10-CM | POA: Diagnosis not present

## 2021-11-27 DIAGNOSIS — Z7689 Persons encountering health services in other specified circumstances: Secondary | ICD-10-CM | POA: Diagnosis not present

## 2021-11-28 DIAGNOSIS — Z7689 Persons encountering health services in other specified circumstances: Secondary | ICD-10-CM | POA: Diagnosis not present

## 2021-12-06 DIAGNOSIS — F419 Anxiety disorder, unspecified: Secondary | ICD-10-CM | POA: Diagnosis not present

## 2021-12-06 DIAGNOSIS — F324 Major depressive disorder, single episode, in partial remission: Secondary | ICD-10-CM | POA: Diagnosis not present

## 2021-12-06 DIAGNOSIS — Z7689 Persons encountering health services in other specified circumstances: Secondary | ICD-10-CM | POA: Diagnosis not present

## 2021-12-10 DIAGNOSIS — Z7689 Persons encountering health services in other specified circumstances: Secondary | ICD-10-CM | POA: Diagnosis not present

## 2021-12-11 DIAGNOSIS — Z7689 Persons encountering health services in other specified circumstances: Secondary | ICD-10-CM | POA: Diagnosis not present

## 2021-12-15 DIAGNOSIS — Z419 Encounter for procedure for purposes other than remedying health state, unspecified: Secondary | ICD-10-CM | POA: Diagnosis not present

## 2021-12-16 DIAGNOSIS — Z7689 Persons encountering health services in other specified circumstances: Secondary | ICD-10-CM | POA: Diagnosis not present

## 2021-12-26 DIAGNOSIS — Z7689 Persons encountering health services in other specified circumstances: Secondary | ICD-10-CM | POA: Diagnosis not present

## 2021-12-30 DIAGNOSIS — Z7689 Persons encountering health services in other specified circumstances: Secondary | ICD-10-CM | POA: Diagnosis not present

## 2022-01-15 DIAGNOSIS — Z419 Encounter for procedure for purposes other than remedying health state, unspecified: Secondary | ICD-10-CM | POA: Diagnosis not present

## 2022-01-28 DIAGNOSIS — Z7689 Persons encountering health services in other specified circumstances: Secondary | ICD-10-CM | POA: Diagnosis not present

## 2022-01-31 DIAGNOSIS — Z7689 Persons encountering health services in other specified circumstances: Secondary | ICD-10-CM | POA: Diagnosis not present

## 2022-02-14 DIAGNOSIS — Z419 Encounter for procedure for purposes other than remedying health state, unspecified: Secondary | ICD-10-CM | POA: Diagnosis not present

## 2022-02-18 DIAGNOSIS — Z7689 Persons encountering health services in other specified circumstances: Secondary | ICD-10-CM | POA: Diagnosis not present

## 2022-02-20 DIAGNOSIS — Z7689 Persons encountering health services in other specified circumstances: Secondary | ICD-10-CM | POA: Diagnosis not present

## 2022-02-21 DIAGNOSIS — Z7689 Persons encountering health services in other specified circumstances: Secondary | ICD-10-CM | POA: Diagnosis not present

## 2022-02-24 DIAGNOSIS — Z7689 Persons encountering health services in other specified circumstances: Secondary | ICD-10-CM | POA: Diagnosis not present

## 2022-02-25 DIAGNOSIS — Z7689 Persons encountering health services in other specified circumstances: Secondary | ICD-10-CM | POA: Diagnosis not present

## 2022-02-26 DIAGNOSIS — Z7689 Persons encountering health services in other specified circumstances: Secondary | ICD-10-CM | POA: Diagnosis not present

## 2022-02-27 DIAGNOSIS — Z7689 Persons encountering health services in other specified circumstances: Secondary | ICD-10-CM | POA: Diagnosis not present

## 2022-02-28 DIAGNOSIS — Z7689 Persons encountering health services in other specified circumstances: Secondary | ICD-10-CM | POA: Diagnosis not present

## 2022-03-03 DIAGNOSIS — Z7689 Persons encountering health services in other specified circumstances: Secondary | ICD-10-CM | POA: Diagnosis not present

## 2022-03-04 DIAGNOSIS — Z7689 Persons encountering health services in other specified circumstances: Secondary | ICD-10-CM | POA: Diagnosis not present

## 2022-03-05 DIAGNOSIS — Z7689 Persons encountering health services in other specified circumstances: Secondary | ICD-10-CM | POA: Diagnosis not present

## 2022-03-06 DIAGNOSIS — Z7689 Persons encountering health services in other specified circumstances: Secondary | ICD-10-CM | POA: Diagnosis not present

## 2022-03-07 DIAGNOSIS — Z7689 Persons encountering health services in other specified circumstances: Secondary | ICD-10-CM | POA: Diagnosis not present

## 2022-03-17 DIAGNOSIS — Z419 Encounter for procedure for purposes other than remedying health state, unspecified: Secondary | ICD-10-CM | POA: Diagnosis not present

## 2022-03-25 DIAGNOSIS — Z7689 Persons encountering health services in other specified circumstances: Secondary | ICD-10-CM | POA: Diagnosis not present

## 2022-03-26 DIAGNOSIS — Z7689 Persons encountering health services in other specified circumstances: Secondary | ICD-10-CM | POA: Diagnosis not present

## 2022-03-27 DIAGNOSIS — Z7689 Persons encountering health services in other specified circumstances: Secondary | ICD-10-CM | POA: Diagnosis not present

## 2022-03-28 DIAGNOSIS — Z7689 Persons encountering health services in other specified circumstances: Secondary | ICD-10-CM | POA: Diagnosis not present

## 2022-04-17 DIAGNOSIS — Z419 Encounter for procedure for purposes other than remedying health state, unspecified: Secondary | ICD-10-CM | POA: Diagnosis not present

## 2022-04-21 DIAGNOSIS — Z7689 Persons encountering health services in other specified circumstances: Secondary | ICD-10-CM | POA: Diagnosis not present

## 2022-04-22 DIAGNOSIS — Z7689 Persons encountering health services in other specified circumstances: Secondary | ICD-10-CM | POA: Diagnosis not present

## 2022-04-28 DIAGNOSIS — Z7689 Persons encountering health services in other specified circumstances: Secondary | ICD-10-CM | POA: Diagnosis not present

## 2022-04-29 DIAGNOSIS — Z7689 Persons encountering health services in other specified circumstances: Secondary | ICD-10-CM | POA: Diagnosis not present

## 2022-05-06 DIAGNOSIS — Z7689 Persons encountering health services in other specified circumstances: Secondary | ICD-10-CM | POA: Diagnosis not present

## 2022-05-08 DIAGNOSIS — Z7689 Persons encountering health services in other specified circumstances: Secondary | ICD-10-CM | POA: Diagnosis not present

## 2022-05-13 DIAGNOSIS — Z7689 Persons encountering health services in other specified circumstances: Secondary | ICD-10-CM | POA: Diagnosis not present

## 2022-05-15 DIAGNOSIS — Z7689 Persons encountering health services in other specified circumstances: Secondary | ICD-10-CM | POA: Diagnosis not present

## 2022-05-16 DIAGNOSIS — Z419 Encounter for procedure for purposes other than remedying health state, unspecified: Secondary | ICD-10-CM | POA: Diagnosis not present

## 2022-05-19 DIAGNOSIS — Z7689 Persons encountering health services in other specified circumstances: Secondary | ICD-10-CM | POA: Diagnosis not present

## 2022-05-26 DIAGNOSIS — Z7689 Persons encountering health services in other specified circumstances: Secondary | ICD-10-CM | POA: Diagnosis not present

## 2022-05-29 DIAGNOSIS — Z7689 Persons encountering health services in other specified circumstances: Secondary | ICD-10-CM | POA: Diagnosis not present

## 2022-06-02 DIAGNOSIS — Z7689 Persons encountering health services in other specified circumstances: Secondary | ICD-10-CM | POA: Diagnosis not present

## 2022-06-05 DIAGNOSIS — Z7689 Persons encountering health services in other specified circumstances: Secondary | ICD-10-CM | POA: Diagnosis not present

## 2022-06-06 DIAGNOSIS — Z7689 Persons encountering health services in other specified circumstances: Secondary | ICD-10-CM | POA: Diagnosis not present

## 2022-06-09 DIAGNOSIS — Z7689 Persons encountering health services in other specified circumstances: Secondary | ICD-10-CM | POA: Diagnosis not present

## 2022-06-16 DIAGNOSIS — Z7689 Persons encountering health services in other specified circumstances: Secondary | ICD-10-CM | POA: Diagnosis not present

## 2022-06-16 DIAGNOSIS — Z419 Encounter for procedure for purposes other than remedying health state, unspecified: Secondary | ICD-10-CM | POA: Diagnosis not present

## 2022-06-19 DIAGNOSIS — Z7689 Persons encountering health services in other specified circumstances: Secondary | ICD-10-CM | POA: Diagnosis not present

## 2022-06-26 DIAGNOSIS — Z7689 Persons encountering health services in other specified circumstances: Secondary | ICD-10-CM | POA: Diagnosis not present

## 2022-06-30 DIAGNOSIS — Z7689 Persons encountering health services in other specified circumstances: Secondary | ICD-10-CM | POA: Diagnosis not present

## 2022-07-01 DIAGNOSIS — Z7689 Persons encountering health services in other specified circumstances: Secondary | ICD-10-CM | POA: Diagnosis not present

## 2022-07-07 DIAGNOSIS — Z7689 Persons encountering health services in other specified circumstances: Secondary | ICD-10-CM | POA: Diagnosis not present

## 2022-07-10 DIAGNOSIS — Z7689 Persons encountering health services in other specified circumstances: Secondary | ICD-10-CM | POA: Diagnosis not present

## 2022-07-16 DIAGNOSIS — Z419 Encounter for procedure for purposes other than remedying health state, unspecified: Secondary | ICD-10-CM | POA: Diagnosis not present

## 2022-07-17 DIAGNOSIS — Z7689 Persons encountering health services in other specified circumstances: Secondary | ICD-10-CM | POA: Diagnosis not present

## 2022-07-18 DIAGNOSIS — Z7689 Persons encountering health services in other specified circumstances: Secondary | ICD-10-CM | POA: Diagnosis not present

## 2022-07-21 DIAGNOSIS — Z7689 Persons encountering health services in other specified circumstances: Secondary | ICD-10-CM | POA: Diagnosis not present

## 2022-07-28 DIAGNOSIS — Z7689 Persons encountering health services in other specified circumstances: Secondary | ICD-10-CM | POA: Diagnosis not present

## 2022-07-31 DIAGNOSIS — Z7689 Persons encountering health services in other specified circumstances: Secondary | ICD-10-CM | POA: Diagnosis not present

## 2022-08-07 DIAGNOSIS — Z7689 Persons encountering health services in other specified circumstances: Secondary | ICD-10-CM | POA: Diagnosis not present

## 2022-08-12 DIAGNOSIS — Z7689 Persons encountering health services in other specified circumstances: Secondary | ICD-10-CM | POA: Diagnosis not present

## 2022-08-14 DIAGNOSIS — Z7689 Persons encountering health services in other specified circumstances: Secondary | ICD-10-CM | POA: Diagnosis not present

## 2022-08-16 DIAGNOSIS — Z419 Encounter for procedure for purposes other than remedying health state, unspecified: Secondary | ICD-10-CM | POA: Diagnosis not present

## 2022-09-05 DIAGNOSIS — Z7689 Persons encountering health services in other specified circumstances: Secondary | ICD-10-CM | POA: Diagnosis not present

## 2022-09-15 DIAGNOSIS — Z419 Encounter for procedure for purposes other than remedying health state, unspecified: Secondary | ICD-10-CM | POA: Diagnosis not present

## 2022-09-17 DIAGNOSIS — Z7689 Persons encountering health services in other specified circumstances: Secondary | ICD-10-CM | POA: Diagnosis not present

## 2022-09-24 DIAGNOSIS — Z7689 Persons encountering health services in other specified circumstances: Secondary | ICD-10-CM | POA: Diagnosis not present

## 2022-09-26 DIAGNOSIS — Z7689 Persons encountering health services in other specified circumstances: Secondary | ICD-10-CM | POA: Diagnosis not present

## 2022-10-02 DIAGNOSIS — Z7689 Persons encountering health services in other specified circumstances: Secondary | ICD-10-CM | POA: Diagnosis not present

## 2022-10-06 DIAGNOSIS — Z7689 Persons encountering health services in other specified circumstances: Secondary | ICD-10-CM | POA: Diagnosis not present

## 2022-10-07 DIAGNOSIS — Z7689 Persons encountering health services in other specified circumstances: Secondary | ICD-10-CM | POA: Diagnosis not present

## 2022-10-16 DIAGNOSIS — Z419 Encounter for procedure for purposes other than remedying health state, unspecified: Secondary | ICD-10-CM | POA: Diagnosis not present

## 2022-11-10 DIAGNOSIS — Z7689 Persons encountering health services in other specified circumstances: Secondary | ICD-10-CM | POA: Diagnosis not present

## 2022-11-16 DIAGNOSIS — Z419 Encounter for procedure for purposes other than remedying health state, unspecified: Secondary | ICD-10-CM | POA: Diagnosis not present

## 2022-12-16 DIAGNOSIS — Z419 Encounter for procedure for purposes other than remedying health state, unspecified: Secondary | ICD-10-CM | POA: Diagnosis not present

## 2022-12-30 DIAGNOSIS — Z7689 Persons encountering health services in other specified circumstances: Secondary | ICD-10-CM | POA: Diagnosis not present

## 2023-01-16 DIAGNOSIS — Z419 Encounter for procedure for purposes other than remedying health state, unspecified: Secondary | ICD-10-CM | POA: Diagnosis not present

## 2023-01-20 DIAGNOSIS — Z7689 Persons encountering health services in other specified circumstances: Secondary | ICD-10-CM | POA: Diagnosis not present

## 2023-01-23 DIAGNOSIS — Z7689 Persons encountering health services in other specified circumstances: Secondary | ICD-10-CM | POA: Diagnosis not present

## 2023-01-29 DIAGNOSIS — Z7689 Persons encountering health services in other specified circumstances: Secondary | ICD-10-CM | POA: Diagnosis not present

## 2023-02-03 DIAGNOSIS — Z7689 Persons encountering health services in other specified circumstances: Secondary | ICD-10-CM | POA: Diagnosis not present

## 2023-02-04 DIAGNOSIS — Z7689 Persons encountering health services in other specified circumstances: Secondary | ICD-10-CM | POA: Diagnosis not present

## 2023-02-05 DIAGNOSIS — Z7689 Persons encountering health services in other specified circumstances: Secondary | ICD-10-CM | POA: Diagnosis not present

## 2023-02-06 DIAGNOSIS — Z7689 Persons encountering health services in other specified circumstances: Secondary | ICD-10-CM | POA: Diagnosis not present

## 2023-02-08 DIAGNOSIS — Z7689 Persons encountering health services in other specified circumstances: Secondary | ICD-10-CM | POA: Diagnosis not present

## 2023-02-09 DIAGNOSIS — Z7689 Persons encountering health services in other specified circumstances: Secondary | ICD-10-CM | POA: Diagnosis not present

## 2023-02-10 DIAGNOSIS — Z7689 Persons encountering health services in other specified circumstances: Secondary | ICD-10-CM | POA: Diagnosis not present

## 2023-02-11 DIAGNOSIS — Z7689 Persons encountering health services in other specified circumstances: Secondary | ICD-10-CM | POA: Diagnosis not present

## 2023-02-15 DIAGNOSIS — Z419 Encounter for procedure for purposes other than remedying health state, unspecified: Secondary | ICD-10-CM | POA: Diagnosis not present

## 2023-02-16 DIAGNOSIS — Z7689 Persons encountering health services in other specified circumstances: Secondary | ICD-10-CM | POA: Diagnosis not present

## 2023-02-17 DIAGNOSIS — Z7689 Persons encountering health services in other specified circumstances: Secondary | ICD-10-CM | POA: Diagnosis not present

## 2023-02-20 DIAGNOSIS — Z7689 Persons encountering health services in other specified circumstances: Secondary | ICD-10-CM | POA: Diagnosis not present

## 2023-02-25 DIAGNOSIS — Z7689 Persons encountering health services in other specified circumstances: Secondary | ICD-10-CM | POA: Diagnosis not present

## 2023-03-16 DIAGNOSIS — Z7689 Persons encountering health services in other specified circumstances: Secondary | ICD-10-CM | POA: Diagnosis not present

## 2023-03-18 DIAGNOSIS — Z419 Encounter for procedure for purposes other than remedying health state, unspecified: Secondary | ICD-10-CM | POA: Diagnosis not present

## 2023-03-20 DIAGNOSIS — Z7689 Persons encountering health services in other specified circumstances: Secondary | ICD-10-CM | POA: Diagnosis not present

## 2023-03-23 DIAGNOSIS — Z7689 Persons encountering health services in other specified circumstances: Secondary | ICD-10-CM | POA: Diagnosis not present

## 2023-03-27 DIAGNOSIS — Z7689 Persons encountering health services in other specified circumstances: Secondary | ICD-10-CM | POA: Diagnosis not present

## 2023-03-30 DIAGNOSIS — Z7689 Persons encountering health services in other specified circumstances: Secondary | ICD-10-CM | POA: Diagnosis not present

## 2023-03-31 DIAGNOSIS — Z7689 Persons encountering health services in other specified circumstances: Secondary | ICD-10-CM | POA: Diagnosis not present

## 2023-04-14 DIAGNOSIS — Z7689 Persons encountering health services in other specified circumstances: Secondary | ICD-10-CM | POA: Diagnosis not present

## 2023-04-18 DIAGNOSIS — Z419 Encounter for procedure for purposes other than remedying health state, unspecified: Secondary | ICD-10-CM | POA: Diagnosis not present

## 2023-04-21 DIAGNOSIS — Z7689 Persons encountering health services in other specified circumstances: Secondary | ICD-10-CM | POA: Diagnosis not present

## 2023-04-23 DIAGNOSIS — Z7689 Persons encountering health services in other specified circumstances: Secondary | ICD-10-CM | POA: Diagnosis not present

## 2023-05-01 DIAGNOSIS — Z7689 Persons encountering health services in other specified circumstances: Secondary | ICD-10-CM | POA: Diagnosis not present

## 2023-05-02 DIAGNOSIS — Z7689 Persons encountering health services in other specified circumstances: Secondary | ICD-10-CM | POA: Diagnosis not present

## 2023-05-05 DIAGNOSIS — Z7689 Persons encountering health services in other specified circumstances: Secondary | ICD-10-CM | POA: Diagnosis not present

## 2023-05-12 DIAGNOSIS — Z7689 Persons encountering health services in other specified circumstances: Secondary | ICD-10-CM | POA: Diagnosis not present

## 2023-05-16 DIAGNOSIS — Z419 Encounter for procedure for purposes other than remedying health state, unspecified: Secondary | ICD-10-CM | POA: Diagnosis not present

## 2023-05-20 DIAGNOSIS — Z7689 Persons encountering health services in other specified circumstances: Secondary | ICD-10-CM | POA: Diagnosis not present

## 2023-06-01 DIAGNOSIS — Z7689 Persons encountering health services in other specified circumstances: Secondary | ICD-10-CM | POA: Diagnosis not present

## 2023-06-05 DIAGNOSIS — Z7689 Persons encountering health services in other specified circumstances: Secondary | ICD-10-CM | POA: Diagnosis not present

## 2023-06-08 DIAGNOSIS — Z7689 Persons encountering health services in other specified circumstances: Secondary | ICD-10-CM | POA: Diagnosis not present

## 2023-06-15 DIAGNOSIS — Z7689 Persons encountering health services in other specified circumstances: Secondary | ICD-10-CM | POA: Diagnosis not present

## 2023-06-17 DIAGNOSIS — Z7689 Persons encountering health services in other specified circumstances: Secondary | ICD-10-CM | POA: Diagnosis not present

## 2023-06-19 DIAGNOSIS — Z7689 Persons encountering health services in other specified circumstances: Secondary | ICD-10-CM | POA: Diagnosis not present

## 2023-06-19 DIAGNOSIS — T451X5A Adverse effect of antineoplastic and immunosuppressive drugs, initial encounter: Secondary | ICD-10-CM | POA: Diagnosis not present

## 2023-06-19 DIAGNOSIS — C4021 Malignant neoplasm of long bones of right lower limb: Secondary | ICD-10-CM | POA: Diagnosis not present

## 2023-06-19 DIAGNOSIS — D701 Agranulocytosis secondary to cancer chemotherapy: Secondary | ICD-10-CM | POA: Diagnosis not present

## 2023-06-22 DIAGNOSIS — Z7689 Persons encountering health services in other specified circumstances: Secondary | ICD-10-CM | POA: Diagnosis not present

## 2023-06-26 DIAGNOSIS — I10 Essential (primary) hypertension: Secondary | ICD-10-CM | POA: Diagnosis not present

## 2023-06-27 DIAGNOSIS — Z419 Encounter for procedure for purposes other than remedying health state, unspecified: Secondary | ICD-10-CM | POA: Diagnosis not present

## 2023-06-29 DIAGNOSIS — Z7689 Persons encountering health services in other specified circumstances: Secondary | ICD-10-CM | POA: Diagnosis not present

## 2023-06-30 DIAGNOSIS — Z7689 Persons encountering health services in other specified circumstances: Secondary | ICD-10-CM | POA: Diagnosis not present

## 2023-07-08 ENCOUNTER — Telehealth: Payer: Self-pay

## 2023-07-08 DIAGNOSIS — C419 Malignant neoplasm of bone and articular cartilage, unspecified: Secondary | ICD-10-CM

## 2023-07-08 DIAGNOSIS — Z7689 Persons encountering health services in other specified circumstances: Secondary | ICD-10-CM | POA: Diagnosis not present

## 2023-07-09 ENCOUNTER — Telehealth: Payer: Self-pay | Admitting: *Deleted

## 2023-07-09 NOTE — Progress Notes (Signed)
 Complex Care Management Note  Care Guide Note 07/09/2023 Name: [name] MRN: [id number] DOB: [birth year]  Edwin Cook is a 28 y.o. year old male who sees Patient, No Pcp Per for primary care. I reached out to Edwin Cook by phone today to offer complex care management services.  [name] was given information about Complex Care Management services today including:   The Complex Care Management services include support from the care team which includes your Nurse Care Manager, Clinical Social Worker, or Pharmacist.  The Complex Care Management team is here to help remove barriers to the health concerns and goals most important to you. Complex Care Management services are voluntary, and the patient may decline or stop services at any time by request to their care team member.   Complex Care Management Consent Status: Patient did not agree to participate in complex care management services at this time. Or establishing care with [name]. Ask patient to call health plan an update with new primary care physician.   Follow up plan: None   Encounter Outcome:  Patient Refused Patient prefers [facility] for health needs. Routed referral back to [name] to notify [facility] of declined referral.   [name]  [name]  [name], [name] Guide  Direct Dial: [phone]  Fax [phone]

## 2023-07-10 DIAGNOSIS — R69 Illness, unspecified: Secondary | ICD-10-CM | POA: Diagnosis not present

## 2023-07-15 DIAGNOSIS — Z7689 Persons encountering health services in other specified circumstances: Secondary | ICD-10-CM | POA: Diagnosis not present

## 2023-07-21 DIAGNOSIS — Z7689 Persons encountering health services in other specified circumstances: Secondary | ICD-10-CM | POA: Diagnosis not present

## 2023-07-27 DIAGNOSIS — Z419 Encounter for procedure for purposes other than remedying health state, unspecified: Secondary | ICD-10-CM | POA: Diagnosis not present

## 2023-07-28 DIAGNOSIS — Z7689 Persons encountering health services in other specified circumstances: Secondary | ICD-10-CM | POA: Diagnosis not present

## 2023-07-30 DIAGNOSIS — Z7689 Persons encountering health services in other specified circumstances: Secondary | ICD-10-CM | POA: Diagnosis not present

## 2023-08-01 DIAGNOSIS — R69 Illness, unspecified: Secondary | ICD-10-CM | POA: Diagnosis not present

## 2023-08-03 DIAGNOSIS — Z7689 Persons encountering health services in other specified circumstances: Secondary | ICD-10-CM | POA: Diagnosis not present

## 2023-08-11 DIAGNOSIS — Z7689 Persons encountering health services in other specified circumstances: Secondary | ICD-10-CM | POA: Diagnosis not present

## 2023-08-18 DIAGNOSIS — Z7689 Persons encountering health services in other specified circumstances: Secondary | ICD-10-CM | POA: Diagnosis not present

## 2023-08-19 DIAGNOSIS — Z7689 Persons encountering health services in other specified circumstances: Secondary | ICD-10-CM | POA: Diagnosis not present

## 2023-08-22 DIAGNOSIS — R69 Illness, unspecified: Secondary | ICD-10-CM | POA: Diagnosis not present

## 2023-08-27 DIAGNOSIS — Z419 Encounter for procedure for purposes other than remedying health state, unspecified: Secondary | ICD-10-CM | POA: Diagnosis not present

## 2023-08-28 DIAGNOSIS — Z7689 Persons encountering health services in other specified circumstances: Secondary | ICD-10-CM | POA: Diagnosis not present

## 2023-08-31 DIAGNOSIS — Z7689 Persons encountering health services in other specified circumstances: Secondary | ICD-10-CM | POA: Diagnosis not present

## 2023-09-01 DIAGNOSIS — Z7689 Persons encountering health services in other specified circumstances: Secondary | ICD-10-CM | POA: Diagnosis not present

## 2023-09-09 DIAGNOSIS — Z7689 Persons encountering health services in other specified circumstances: Secondary | ICD-10-CM | POA: Diagnosis not present

## 2023-09-15 DIAGNOSIS — Z7689 Persons encountering health services in other specified circumstances: Secondary | ICD-10-CM | POA: Diagnosis not present

## 2023-09-17 DIAGNOSIS — Z7689 Persons encountering health services in other specified circumstances: Secondary | ICD-10-CM | POA: Diagnosis not present

## 2023-09-17 DIAGNOSIS — R69 Illness, unspecified: Secondary | ICD-10-CM | POA: Diagnosis not present

## 2023-09-21 DIAGNOSIS — Z7689 Persons encountering health services in other specified circumstances: Secondary | ICD-10-CM | POA: Diagnosis not present

## 2023-09-26 DIAGNOSIS — Z419 Encounter for procedure for purposes other than remedying health state, unspecified: Secondary | ICD-10-CM | POA: Diagnosis not present

## 2023-09-29 DIAGNOSIS — Z7689 Persons encountering health services in other specified circumstances: Secondary | ICD-10-CM | POA: Diagnosis not present

## 2023-10-05 DIAGNOSIS — Z7689 Persons encountering health services in other specified circumstances: Secondary | ICD-10-CM | POA: Diagnosis not present

## 2023-10-15 DIAGNOSIS — R69 Illness, unspecified: Secondary | ICD-10-CM | POA: Diagnosis not present

## 2023-10-16 DIAGNOSIS — R69 Illness, unspecified: Secondary | ICD-10-CM | POA: Diagnosis not present

## 2023-10-16 DIAGNOSIS — Z7689 Persons encountering health services in other specified circumstances: Secondary | ICD-10-CM | POA: Diagnosis not present

## 2023-10-20 DIAGNOSIS — Z7689 Persons encountering health services in other specified circumstances: Secondary | ICD-10-CM | POA: Diagnosis not present

## 2023-10-27 DIAGNOSIS — Z419 Encounter for procedure for purposes other than remedying health state, unspecified: Secondary | ICD-10-CM | POA: Diagnosis not present

## 2023-10-28 DIAGNOSIS — Z7689 Persons encountering health services in other specified circumstances: Secondary | ICD-10-CM | POA: Diagnosis not present

## 2023-10-31 DIAGNOSIS — R69 Illness, unspecified: Secondary | ICD-10-CM | POA: Diagnosis not present

## 2023-11-02 DIAGNOSIS — Z7689 Persons encountering health services in other specified circumstances: Secondary | ICD-10-CM | POA: Diagnosis not present

## 2023-11-05 DIAGNOSIS — Z7689 Persons encountering health services in other specified circumstances: Secondary | ICD-10-CM | POA: Diagnosis not present

## 2023-11-09 DIAGNOSIS — Z7689 Persons encountering health services in other specified circumstances: Secondary | ICD-10-CM | POA: Diagnosis not present

## 2023-11-10 DIAGNOSIS — Z7689 Persons encountering health services in other specified circumstances: Secondary | ICD-10-CM | POA: Diagnosis not present

## 2023-11-26 DIAGNOSIS — Z7689 Persons encountering health services in other specified circumstances: Secondary | ICD-10-CM | POA: Diagnosis not present

## 2023-11-27 DIAGNOSIS — Z419 Encounter for procedure for purposes other than remedying health state, unspecified: Secondary | ICD-10-CM | POA: Diagnosis not present

## 2023-12-01 DIAGNOSIS — Z7689 Persons encountering health services in other specified circumstances: Secondary | ICD-10-CM | POA: Diagnosis not present

## 2023-12-03 DIAGNOSIS — Z7689 Persons encountering health services in other specified circumstances: Secondary | ICD-10-CM | POA: Diagnosis not present

## 2023-12-04 DIAGNOSIS — Z7689 Persons encountering health services in other specified circumstances: Secondary | ICD-10-CM | POA: Diagnosis not present

## 2023-12-07 DIAGNOSIS — Z7689 Persons encountering health services in other specified circumstances: Secondary | ICD-10-CM | POA: Diagnosis not present

## 2023-12-14 DIAGNOSIS — Z7689 Persons encountering health services in other specified circumstances: Secondary | ICD-10-CM | POA: Diagnosis not present

## 2023-12-15 DIAGNOSIS — Z7689 Persons encountering health services in other specified circumstances: Secondary | ICD-10-CM | POA: Diagnosis not present

## 2023-12-18 DIAGNOSIS — Z7689 Persons encountering health services in other specified circumstances: Secondary | ICD-10-CM | POA: Diagnosis not present

## 2023-12-24 DIAGNOSIS — Z7689 Persons encountering health services in other specified circumstances: Secondary | ICD-10-CM | POA: Diagnosis not present

## 2023-12-28 DIAGNOSIS — Z7689 Persons encountering health services in other specified circumstances: Secondary | ICD-10-CM | POA: Diagnosis not present

## 2023-12-29 DIAGNOSIS — Z7689 Persons encountering health services in other specified circumstances: Secondary | ICD-10-CM | POA: Diagnosis not present

## 2023-12-30 DIAGNOSIS — Z7689 Persons encountering health services in other specified circumstances: Secondary | ICD-10-CM | POA: Diagnosis not present

## 2023-12-31 DIAGNOSIS — Z7689 Persons encountering health services in other specified circumstances: Secondary | ICD-10-CM | POA: Diagnosis not present

## 2024-01-01 DIAGNOSIS — Z7689 Persons encountering health services in other specified circumstances: Secondary | ICD-10-CM | POA: Diagnosis not present

## 2024-01-07 DIAGNOSIS — Z7689 Persons encountering health services in other specified circumstances: Secondary | ICD-10-CM | POA: Diagnosis not present

## 2024-01-11 DIAGNOSIS — Z7689 Persons encountering health services in other specified circumstances: Secondary | ICD-10-CM | POA: Diagnosis not present

## 2024-01-18 DIAGNOSIS — Z7689 Persons encountering health services in other specified circumstances: Secondary | ICD-10-CM | POA: Diagnosis not present

## 2024-01-19 DIAGNOSIS — Z7689 Persons encountering health services in other specified circumstances: Secondary | ICD-10-CM | POA: Diagnosis not present

## 2024-01-20 DIAGNOSIS — Z7689 Persons encountering health services in other specified circumstances: Secondary | ICD-10-CM | POA: Diagnosis not present

## 2024-01-21 DIAGNOSIS — Z7689 Persons encountering health services in other specified circumstances: Secondary | ICD-10-CM | POA: Diagnosis not present

## 2024-02-03 DIAGNOSIS — Z7689 Persons encountering health services in other specified circumstances: Secondary | ICD-10-CM | POA: Diagnosis not present

## 2024-02-04 NOTE — Progress Notes (Signed)
 PATIENT PROFILE: Edwin Cook is a 28 y.o. male who is seen in follow up for a diagnosis of metastatic Ewing sarcoma.    ONCOLOGIC PROBLEM LIST:  1. Metastatic Ewing sarcoma     A. 2010 presented with right hip/leg pain. MRI showed a 15.4 cm heterogeneous enhancing lesion extending from the intertrochanteric line of the right femur into the mid-diaphysis.     B. 09/11/08 underwent biopsy.  Path: Ewing sarcoma. Bone marrow negative. Staging imaging negative for metastasis.     C. Treated per [id number] interval compressed chemotherapy with radical resection on 12/22/08. Path: 100% necrosis.  Continuation of chemotherapy through 05/01/09.  Entered on surveillance.      D. 08/13/19 presented to ED with chest pain, fever, SOB.  CT showed large left pleural effusion, cytology negative for malignancy.       E. 08/19/19 thoracoscopy with biopsy of multiple pleural nodules.  Path: metastatic Ewing sarcoma. Imaging showed mediastinal adenopathy.     F. 09/05/19 - 06/22/20 completed 14 cycles cyclophosphamide  plus topotecan  with response/stability of disease by C12. Stopped due to ICU hosp for VTE (had planned total 15 cycles, based on imaging after C12.)  03/26/20 - admitted for COVID pna with neutropenic fever.  07/08/20 - admitted to ICU with PE and non-neutropenic PNA sepsis/hypovolemia     G. 03/07/21 CT CAP revealing new small 4mm subpleural left lung nodule; similar pleural thickening and nodularity/scarring in left lung base.      H. 04/02/21 CTA for chest pain showed no PE, increased size of subpleural LLL nodule measuring 8mm.       I.  04/07/21 - stress echo was unremarkable per pt (no report available)     J.  04/25/21 - CTA for chest pain showed no PE     K. 05/07/21 - New patient appt with Dr Claryce     L. 05/20/21 - PET - LLL subpleural nodule with associated pleural thickening. Uptake along stem of femur favored to be post surgical.     M. 06/10/21 restart cyclophosphamide  plus  topotecan . Restaging CT Chest in May, July 2023 showing slight improvement in the baseline subtle pleural thickening     N. 12/18/21 underwent Left VATS pleurectomy with Dr. Eluterio parietal pleurectomy, pathology with patchy pleural fibrosis with marked hemosiderin deposition, no acute inflammation or malignancy identified      O. Post-up RT deferred d/t COVID infection and decreased PFTs.       P. 03/04/22 restarted cyclophosphamide  plus topotecan  with plan for 2 cycles (requiring DR to 3 days due to mild cytopenias) followed by restaging imaging and RT with best response of PD.     Q. 2/20 - 07/31/22 TiNKS trial (gemcitabine/docetaxel/NK cell infusions)       R. 09/26/22 PEF of peripheral left lower lobe nodule under general anesthesia (09/05/22 PEF aborted due to intol muscle spasms).  S. 10/07/22 CT Persistent mild pleural thickening in the dependent LEFT lower lobe progressive increase in size of pleural-based nodule measuring 2.4 x 1.7 cm (series 4, image 261), previously 1.2 x 1 cm, possibly c/w treatment effect T. 12/30/22 CT with ongoing increased pleural thickening along the LLL mass. U. 03/16/23 CT noncontrast new 6 x 3 cm soft tissue nodule L flank associated with pain V. 03/19/2023 Seen in [facility] ED for chest pain; CT chest PE suspicious left posterior pleural mass, concerning for mets & new small left pleural effusion W. 04/23/2023 Left pleural cryoablation (Mammarappallil) X. 06/08/2023 C1D1 High-dose ifosfamide (inpatient); 06/30/2023 C2 Ifosfamide ;  07/21/2023 CT CAP found interval improvement; OK to proceed with C3; 08/11/2023 C4 Ifosfamide; 09/01/2023 CT Chest: interval improvement; OK to proceed with C5 Ifosfamide; 09/29/2023 C6 Ifosfamide; 10/20/2023 CT Chest: most lesions smaller, with one new 9mm nodule will monitor; OKTT C7 Ifosfamide; 11/10/2023 C8 Ifosfamide; 12/01/2023 CT: interval enlargement and/or appearance of small bilateral pulmonary nodules, some of which are juxta pleural, suspicious for  additional sites of hematogenous mets. Tx related changes of left lower thorax with persistent areas of presumed pleural mets. Plan for patient to be admitted for phosphorus replenishment and blood transfusion.  12/01/23: CT Chest with interval enlargement of small bilateral pulmonary nodules (some juxta pleural), suspicious for sites of a hematogenous metastasis Y. 12/25/2023 C1D1 Vincristine / Irinotecan / Temozolomide; 01/18/2024 C2 VIT; 02/08/2024 CT Chest: mild interval progression in lung mets, stable dominant pleural based mets; discussed TKI (e.g. regorafenib) vs. VIT. OKTT C3 VIT but will explore trial options specifically in [place]. Unfortunately per d/w contact at [facility] no good Ewing trial options there.    HISTORY OF PRESENT ILLNESS:  See onc hx above    INTERVAL HISTORY: C2 much better than C1 3 days heavy diarrhea then improved with immodium and lomotil  Moderate nausea also improved Energy so so No fevers No new sx   REVIEW OF SYSTEMS: A ROS was performed including pertinent positives and negatives as documented in the HPI. All other systems reviewed are negative.     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  Added automatically from request for surgery [id number] Formatting of this note might be different from the original.  Added automatically from request for surgery [id number]   Ewing sarcoma (CMS/HHS-HCC)     Ewing's sarcoma (CMS/HHS-HCC) 08/15/2008    Righ proximal femur   Ewing's sarcoma of femur (CMS/HHS-HCC) 09/11/2008    Right femur & hip - limb sparing surgery & chemotherapy   Fatigue 11/16/2010   Fever of unknown origin, unspecified 11/16/2010    Persistent     Headache(784.0)     Metatarsal stress fracture of right foot with delayed healing 05/09/2014   Obesity     PE (pulmonary thromboembolism) (CMS/HHS-HCC) 05/27/2021   Pleural effusion, malignant (CMS-HCC) 05/08/2021   Sinusitis, unspecified          PAST SURGICAL HISTORY: Past Surgical History       Past Surgical History:  Procedure Laterality Date   PARTIAL HIP ARTHROPLASTY Right 7/10    For Ewings Sarcoma - partial with rod placement   THORACOSCOPY Left 12/18/2021    Procedure: THORACOSCOPY; WITH BIOPSY(IES) OF PLEURA;  Surgeon: Valli Dickey Fitch, MD;  Location: [facility];  Service: Cardiothoracic;  Laterality: Left;   THORACOSCOPY W/PARIETAL PLEURECTOMY Left 12/18/2021    Procedure: THORACOSCOPY, SURGICAL; WITH PARIETAL PLEURECTOMY;  Surgeon: Valli Dickey Fitch, MD;  Location: [facility];  Service: Cardiothoracic;  Laterality: Left;        FAMILY HISTORY: Family History        Family History  Problem Relation Name Age of Onset   Diabetes type I Mother       Hypothyroidism Mother       Skin cancer Mother       Asthma  Father       Thyroid disease Brother 38     Asthma Brother 67     Diabetes type I Brother 17     Hypothyroidism Brother 17     Skin cancer Maternal Grandmother       Testicular cancer Paternal Uncle       Late puberty Neg Hx       Early puberty Neg Hx       Anesthesia problems Neg Hx       Malignant hyperthermia Neg Hx            SOCIAL HISTORY Social History   Socioeconomic History   Marital status: Single  Tobacco Use   Smoking status: Former    Current packs/day: 0.00    Average packs/day: 0.3 packs/day for 5.0 years (1.5 ttl pk-yrs)    Types: Cigarettes    Start date: 04/17/2014    Quit date: 04/18/2019    Years since quitting: 4.8   Smokeless tobacco: Never  Vaping Use   Vaping status: Former   Quit date: 04/18/2019  Substance and Sexual Activity   Alcohol use: Yes    Alcohol/week: 1.0 standard  drink of alcohol    Types: 1 Glasses of wine per week    Comment: occasional use~1/month   Drug use: Not Currently   Sexual activity: Defer   Social Drivers of Health   Financial Resource Strain: Low Risk  (12/01/2023)   Overall Financial Resource Strain (CARDIA)    Difficulty of Paying Living Expenses: Not very hard  Food Insecurity: No Food Insecurity (12/01/2023)   Hunger Vital Sign    Worried About Running Out of Food in the Last Year: Never true    Ran Out of Food in the Last Year: Never true  Transportation Needs: No Transportation Needs (12/01/2023)   PRAPARE - Administrator, Civil Service (Medical): No    Lack of Transportation (Non-Medical): No  Housing Stability: Low Risk  (12/01/2023)   Housing Stability Vital Sign    Unable to Pay for Housing in the Last Year: No    Number of Times Moved in the Last Year: 0    Homeless in the Last Year: No     ALLERGIES: Allergies  Allergen Reactions   Morphine Rash      CURRENT MEDICATIONS: Current Medications     Your Medication List      * Accurate as of February 08, 2024  4:33 PM. Always use your most recent med list.          Instructions 8 am 12 pm 5 pm 8 pm Notes  cefixime 400 mg capsule Commonly known as: SUPRAX  Take 1 capsule (400mg ) by mouth daily starting 2 days prior to the start of chemotherapy, and continue through Day 8 (10 days total)             cetirizine 10 MG tablet Commonly known as: ZyrTEC  Take 1 tablet (10 mg total) by mouth once daily             dexAMETHasone  4 MG tablet Commonly known as: DECADRON   Take 2 tablets (8mg ) by mouth in the morning for 2 days after chemotherapy, then as directed.             diphenoxylate -atropine  2.5-0.025 mg tablet Commonly known as: LOMOTIL   Take 1 tablet by mouth 4 (four) times daily as needed for Diarrhea             [facility]  magic mouthwash  Swish and spit 5 mLs every 6 (six) hours as needed              fluticasone  propionate 50 mcg/actuation nasal spray Commonly known as: FLONASE   Place 2 sprays into both nostrils once daily             loperamide  2 mg capsule Commonly known as: IMODIUM   Take 1 capsule (2 mg total) by mouth 4 (four) times daily as needed for Diarrhea             LORazepam 0.5 MG tablet Commonly known as: ATIVAN  Take 0.5 mg by mouth at bedtime as needed             methocarbamoL 750 MG tablet Commonly known as: ROBAXIN  Take 1 tablet (750 mg total) by mouth 3 (three) times daily as needed             methylphenidate  HCl 5 MG tablet Commonly known as: RITALIN   Take 1 tablet (5 mg total) by mouth once daily             naloxone 4 mg/actuation nasal spray Commonly known as: NARCAN  For suspected opioid overdose, call 911 and give a single spray into ONE nostril. If no or minimal response after 3 minutes, give another spray in OTHER nostril.             nystatin 100,000 unit/mL suspension Commonly known as: MYCOSTATIN              OLANZapine 5 MG tablet Commonly known as: ZyPREXA  Take 1 tablet (5 mg total) by mouth at bedtime for 90 days             ondansetron  8 MG tablet Commonly known as: ZOFRAN   Take 1 tablet (8 mg total) by mouth every 8 (eight) hours as needed for Nausea             ondansetron  8 MG tablet Commonly known as: ZOFRAN   Take 1 tablet (8mg ) by mouth twice daily on Days 6 & 7, then take 1 tablet every 8 hours as needed for Nausea and vomiting             oxyCODONE  10 mg immediate release tablet Commonly known as: OxyIR  Take 1 tablet (10 mg total) by mouth every 4 (four) hours as needed for Pain             pegfilgrastim 6 mg/0.6 mL injection Commonly known as: NEULASTA  Inject 6mg  SQ between 24-72 hours after chemotherapy ends. Chemotherapy cycle = 21 days.             polyethylene glycol powder Commonly known as: MIRALAX   Take 17 g by mouth once daily Mix in 4-8ounces of  fluid prior to taking.             potassium chloride  20 MEQ ER tablet Commonly known as: KLOR-CON  M20  Take 2 tablets (40 mEq total) by mouth 2 (two) times daily             prochlorperazine  10 MG tablet Commonly known as: COMPAZINE   Take 1 tablet (10 mg total) by mouth every 8 (eight) hours as needed             SENNA 8.6 mg tablet Generic drug: sennosides  Take 2 tablets by mouth once daily Available OTC             sertraline  100 MG tablet Commonly known as: ZOLOFT   Take 100 mg by mouth once daily             sodium phosphate-potassium phosphate  250 mg tablet Commonly known as: K-PHOS NEUTRAL  Take 3 tablets by mouth 2 (two) times daily for 90 days             temozolomide 100 MG capsule Commonly known as: TEMODAR  Take 200mg  (2 capsules) PO on Days 1-5 of a 21-day cycle. Administer 60 minutes prior to irinotecan.             temozolomide 100 MG capsule Commonly known as: TEMODAR  Take 200mg  (2 capsules) PO on Days 1-5 of a 21-day cycle. Administer 60 minutes prior to irinotecan.             temozolomide 100 MG capsule Commonly known as: TEMODAR  Take 200mg  (2 capsules) PO on Days 1-4 of a 21-day cycle. Administer 60 minutes prior to irinotecan.             ZEPBOUND 5 mg/0.5 mL pen injector Generic drug: tirzepatide                   PHYSICAL EXAM: 177.8 cm (5' 10) 177.8 cm (5' 10)     Vitals:   02/08/24 0949  BP: 103/72  Resp: 15  Temp: 36.9 C (98.4 F)  TempSrc: Oral  SpO2: 96%  Weight: (!) 101.4 kg (223 lb 8.7 oz)  PainSc: 0-No pain      PS of 1.  General Appearance:    Alert, cooperative, no distress, appears well; face swollen moon face c/w chronic steroid use  Skin:   Head:    No rashes or lesions.  Normocephalic, without obvious abnormality, atraumatic  Eyes: Musculoskeletal:    Conjunctiva/corneas clear, EOM's intact  Some tenderness to palpation of right shoulder  Back:     Symmetric,  no curvature  Lungs:     Clear to auscultation bilaterally, no wheezes or rales.   Chest wall:    No tenderness or deformity  Heart:    Tachycardic, regular rhythm, no murmur, rub  or gallop, no edema  Abdomen:   Soft, non-tender, and non-distended, no masses, no hepatosplenomegaly.   Psych:   Mood euthymic, affect appropriate  Neurologic:   Alert and oriented x 3. No focal neuro deficit      LABORATORY STUDIES:  reviewed in epic   PATHOLOGY: Results       Procedure Component Value Units Date/Time    Pathology - General / Other [[id number]] Collected: 12/18/21 1709    Specimen: Tissue-Pathology Updated: 12/23/21 1407      Case Report --      Surgical Pathology                                Case: [id number]                                Authorizing Provider:  Valli Dickey Fitch, MD   Collected:           12/18/2021 1709             Ordering Location:     [facility]    Received:            12/19/2021 9185  Periop                                                                      Pathologist:           Janina Dal Shape, MD                                                     Specimen:    Tissue, Left parietal pleura short stitch marks superior long stitch marks lateral           DIAGNOSIS --      A. Pleura, left parietal, pleurectomy: - Patchy pleural fibrosis with marked hemosiderin deposition, and focal areas of recent hemorrhage - Focal reactive mesothelium, and minimal chronic inflammation - No acute inflammation or malignancy identified        Clinical Information --      Metastatic Ewing sarcoma s/p therapy.   Chest CT scan lung findings (11/22/21):  No pneumothorax. No pleural effusions. Similar appearance of reticular opacities in the right lower lobe. Persistent right lung base posterior nodularity. Similar appearance of posterior pleural thickening in the left lobe, which is  decreased from more remote priors such as  06/10/2021. Slightly more linear  area of atelectasis in the left lung base.   Intraoperative findings: Dense adhesions of lung to the diaphragm and entire chest way requiring extensive adhesiolysis.               RADIOLOGY 02/08/2024 CT Chest: I have personally viewed, independently interpreted, and discussed the following imaging with the patient today, including showing and explaining relevant portions of the images that I see very mild interval progression in the scatterd lung parenchymal lesions (e.g. from 4-->6 mm) with increased sub 4mm nodules but overall stable pleural based mets.  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  Comparison Exams:  12/01/2023  Protocol:  Chest CT WO Protocol.  Contiguous 0.625 mm axial images with 5 mm axial, 3 mm coronal, and 3 mm sagittal reconstructions were obtained from the neck base through the upper abdomen without IV contrast material. In addition, 3D images were created and reviewed, including 3D maximal intensity projection (MIP) reconstructions to potentially increase the sensitivity for the detection of pulmonary nodules.  Findings:  The central airways are patent. There is persistent consolidation within the left lower lobe with extension to the adjacent pleura. An inferior low attenuating pleural collection versus lesion is not significantly changed measuring approximately 62 x 30 mm (image 381 series 4). The left lower lobe consolidative mass is also stable measuring 41 x 32 mm (image 224).  There is interval increase in size of several pulmonary nodules concerning for metastatic disease. For example a left upper lobe 6 mm nodule previously measured 3 mm (image 143). There is a new 4  mm left upper lobe nodule (image 166). Some nodules are  no longer visualized including a juxtapleural nodule on the left. Right lower lobe clustered nodularity has increased with nodules measuring up to 5 mm in size (image 283) . Neck base is normal. The heart, aorta, and pulmonary arteries are of normal size and configuration. There are no appreciable coronary artery atherosclerotic calcifications. Small pericardial effusion.  No mediastinal, hilar, or axillary lymphadenopathy.  Evaluation of the upper abdomen is limited in the absence of IV contrast; imaged portions are unremarkable.  No acute osseous abnormality.   Impression:  1. Interval increase in size and number of several pulmonary nodules concerning for worsening metastatic disease. 2. Overall stable left lower lobe consolidative mass with associated pleural lesion which may reflect treatment related changes, however residual disease is not excluded.  Electronically Signed by:  Emeline Ni, MD, [facility] Electronically Signed on:  02/08/2024 9:33 AM       ASSESSMENT AND PLAN: 28 y.o. male with a diagnosis of Ewing sarcoma of RIGHT femur in 2010 s/p VDC/IE, with LEFT pleural and lymph node metastatic disease     Metastatic Ewing sarcoma with pulmonary involvement   - interval mild progression on CT - discussed continuing with VIT vs. Switch to TKI (e.g. regorafenib) - interested in trial but per my d/w contact at [facility] currently no particularly attractive mEwing trials other than tissue agnostic sarcoma trials. He is specifically interested in considering trials in [place] but would consider elsewhere - if ongoing PD may switch to TKI next restaging but for now since he has better strategies for management of diarrhea, will cont with VIT  Diarrhea - encouraged patient to take up to 16 mg (8 tablets) daily of Imodium ; 2 tablets initially, then 2 more tablets after every subsequent bowel movement - continue  Lomotil  - continue to monitor - maintain hydration during periods of increased diarrhea; can incorporate electrolyte packets  Back pain   - LL back pain that feels like muscle strain. Beyond cancer, no other red flag symptoms. Pain well controlled with voltaren, heat packs, and oxycodone . - MRI Lumbar Spine completed 12/24/2023 reveals tandem compression fx T12, L1 & L2, new compared to prior. Active edema at L1 & T12. 2 small marrow lesions at T12 and L4, nonspecific; metastatic disease unlikely but not excluded.    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

## 2024-02-08 DIAGNOSIS — Z7689 Persons encountering health services in other specified circumstances: Secondary | ICD-10-CM | POA: Diagnosis not present

## 2024-02-10 DIAGNOSIS — Z7689 Persons encountering health services in other specified circumstances: Secondary | ICD-10-CM | POA: Diagnosis not present

## 2024-02-12 DIAGNOSIS — Z7689 Persons encountering health services in other specified circumstances: Secondary | ICD-10-CM | POA: Diagnosis not present

## 2024-02-15 DIAGNOSIS — Z7689 Persons encountering health services in other specified circumstances: Secondary | ICD-10-CM | POA: Diagnosis not present

## 2024-02-17 NOTE — Progress Notes (Signed)
 PATIENT PROFILE: Edwin Cook is a 28 y.o. male who is seen in follow up for a diagnosis of metastatic Ewing sarcoma.    ONCOLOGIC PROBLEM LIST:  1. Metastatic Ewing sarcoma     A. 2010 presented with right hip/leg pain. MRI showed a 15.4 cm heterogeneous enhancing lesion extending from the intertrochanteric line of the right femur into the mid-diaphysis.     B. 09/11/08 underwent biopsy.  Path: Ewing sarcoma. Bone marrow negative. Staging imaging negative for metastasis.     C. Treated per [id number] interval compressed chemotherapy with radical resection on 12/22/08. Path: 100% necrosis.  Continuation of chemotherapy through 05/01/09.  Entered on surveillance.      D. 08/13/19 presented to ED with chest pain, fever, SOB.  CT showed large left pleural effusion, cytology negative for malignancy.       E. 08/19/19 thoracoscopy with biopsy of multiple pleural nodules.  Path: metastatic Ewing sarcoma. Imaging showed mediastinal adenopathy.     F. 09/05/19 - 06/22/20 completed 14 cycles cyclophosphamide  plus topotecan  with response/stability of disease by C12. Stopped due to ICU hosp for VTE (had planned total 15 cycles, based on imaging after C12.)  03/26/20 - admitted for COVID pna with neutropenic fever.  07/08/20 - admitted to ICU with PE and non-neutropenic PNA sepsis/hypovolemia     G. 03/07/21 CT CAP revealing new small 4mm subpleural left lung nodule; similar pleural thickening and nodularity/scarring in left lung base.      H. 04/02/21 CTA for chest pain showed no PE, increased size of subpleural LLL nodule measuring 8mm.       I.  04/07/21 - stress echo was unremarkable per pt (no report available)     J.  04/25/21 - CTA for chest pain showed no PE     K. 05/07/21 - New patient appt with Dr Claryce     L. 05/20/21 - PET - LLL subpleural nodule with associated pleural thickening. Uptake along stem of femur favored to be post surgical.     M. 06/10/21 restart cyclophosphamide  plus  topotecan . Restaging CT Chest in May, July 2023 showing slight improvement in the baseline subtle pleural thickening     N. 12/18/21 underwent Left VATS pleurectomy with Dr. Eluterio parietal pleurectomy, pathology with patchy pleural fibrosis with marked hemosiderin deposition, no acute inflammation or malignancy identified      O. Post-up RT deferred d/t COVID infection and decreased PFTs.       P. 03/04/22 restarted cyclophosphamide  plus topotecan  with plan for 2 cycles (requiring DR to 3 days due to mild cytopenias) followed by restaging imaging and RT with best response of PD.     Q. 2/20 - 07/31/22 TiNKS trial (gemcitabine/docetaxel/NK cell infusions)       R. 09/26/22 PEF of peripheral left lower lobe nodule under general anesthesia (09/05/22 PEF aborted due to intol muscle spasms).  S. 10/07/22 CT Persistent mild pleural thickening in the dependent LEFT lower lobe progressive increase in size of pleural-based nodule measuring 2.4 x 1.7 cm (series 4, image 261), previously 1.2 x 1 cm, possibly c/w treatment effect T. 12/30/22 CT with ongoing increased pleural thickening along the LLL mass. U. 03/16/23 CT noncontrast new 6 x 3 cm soft tissue nodule L flank associated with pain V. 03/19/2023 Seen in [facility] ED for chest pain; CT chest PE suspicious left posterior pleural mass, concerning for mets & new small left pleural effusion W. 04/23/2023 Left pleural cryoablation (Mammarappallil) X. 06/08/2023 C1D1 High-dose ifosfamide (inpatient); 06/30/2023 C2 Ifosfamide ;  07/21/2023 CT CAP found interval improvement; OK to proceed with C3; 08/11/2023 C4 Ifosfamide; 09/01/2023 CT Chest: interval improvement; OK to proceed with C5 Ifosfamide; 09/29/2023 C6 Ifosfamide; 10/20/2023 CT Chest: most lesions smaller, with one new 9mm nodule will monitor; OKTT C7 Ifosfamide; 11/10/2023 C8 Ifosfamide; 12/01/2023 CT: interval enlargement and/or appearance of small bilateral pulmonary nodules, some of which are juxta pleural, suspicious for  additional sites of hematogenous mets. Tx related changes of left lower thorax with persistent areas of presumed pleural mets. Plan for patient to be admitted for phosphorus replenishment and blood transfusion.  12/01/23: CT Chest with interval enlargement of small bilateral pulmonary nodules (some juxta pleural), suspicious for sites of a hematogenous metastasis Y. 12/25/2023 C1D1 Vincristine / Irinotecan / Temozolomide; 01/18/2024 C2 VIT; 02/08/2024 CT Chest: mild interval progression in lung mets, stable dominant pleural based mets; discussed TKI (e.g. regorafenib) vs. VIT. OKTT C3 VIT but will explore trial options specifically in [place]. Unfortunately per d/w contact at [facility] no good Ewing trial options there. Z. 02/29/2024 C4 VIT    HISTORY OF PRESENT ILLNESS:  See onc hx above    INTERVAL HISTORY: Edwin Cook returns today with his mom for the next cycle of chemotherapy. Notes he has been in more pain recently in his back, at the site of his tumor. Rates the pain at a 6 or 7/10. Edwin Cook finds he has been needing to take pain medication more frequently, however he does not always take it around the clock. Feels he has been eating & drinking enough, and will usually try to drink at least one liquid IV daily; his mom notes he eats like a bird. Endorses his left shoulder pain flaring up over the last couple of weeks as well. Sometimes the oxycodone  does not help to relieve the severity of that pain. He continues to apply heat & ice as well. Endorses well-controlled but lingering episodes of diarrhea; Edwin Cook continues to have up to 1 episode of diarrhea daily. The Lomotil  helps greatly, and he asks for a refill of this today. His mom shares Edwin Cook recently tried a [employer] drink and greatly enjoyed it, as it was a thinner liquid than a traditional shake and has a better taste. She wonders if this is something that could potentially be covered by insurance, so he can incorporate it into his daily diet.    Denies any other concerns today.   REVIEW OF SYSTEMS: A ROS was performed including pertinent positives and negatives as documented in the HPI. All other systems reviewed are negative.     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  Added automatically from request for surgery [id number] Formatting of this note might be different from the original.  Added automatically from request for surgery [id number]   Ewing sarcoma (CMS/HHS-HCC)     Ewing's sarcoma (CMS/HHS-HCC) 08/15/2008    Righ proximal femur   Ewing's sarcoma of femur (CMS/HHS-HCC) 09/11/2008    Right femur & hip - limb sparing surgery & chemotherapy   Fatigue 11/16/2010   Fever of unknown origin, unspecified 11/16/2010    Persistent    Headache(784.0)     Metatarsal stress fracture of right foot with delayed healing 05/09/2014  Obesity     PE (pulmonary thromboembolism) (CMS/HHS-HCC) 05/27/2021   Pleural effusion, malignant (CMS-HCC) 05/08/2021   Sinusitis, unspecified          PAST SURGICAL HISTORY: Past Surgical History       Past Surgical History:  Procedure Laterality Date   PARTIAL HIP ARTHROPLASTY Right 7/10    For Ewings Sarcoma - partial with rod placement   THORACOSCOPY Left 12/18/2021    Procedure: THORACOSCOPY; WITH BIOPSY(IES) OF PLEURA;  Surgeon: Valli Dickey Fitch, MD;  Location: [facility]PER[facility];  Service: Cardiothoracic;  Laterality: Left;   THORACOSCOPY W/PARIETAL PLEURECTOMY Left 12/18/2021    Procedure: THORACOSCOPY, SURGICAL; WITH PARIETAL PLEURECTOMY;  Surgeon: Valli Dickey Fitch, MD;  Location: [facility];  Service: Cardiothoracic;  Laterality: Left;        FAMILY HISTORY: Family  History        Family History  Problem Relation Name Age of Onset   Diabetes type I Mother       Hypothyroidism Mother       Skin cancer Mother       Asthma Father       Thyroid disease Brother 76     Asthma Brother 14     Diabetes type I Brother 17     Hypothyroidism Brother 17     Skin cancer Maternal Grandmother       Testicular cancer Paternal Uncle       Late puberty Neg Hx       Early puberty Neg Hx       Anesthesia problems Neg Hx       Malignant hyperthermia Neg Hx            SOCIAL HISTORY Social History   Socioeconomic History   Marital status: Single  Tobacco Use   Smoking status: Former    Current packs/day: 0.00    Average packs/day: 0.3 packs/day for 5.0 years (1.5 ttl pk-yrs)    Types: Cigarettes    Start date: 04/17/2014    Quit date: 04/18/2019    Years since quitting: 4.8   Smokeless tobacco: Never  Vaping Use   Vaping status: Former   Quit date: 04/18/2019  Substance and Sexual Activity   Alcohol use: Yes    Alcohol/week: 1.0 standard drink of alcohol    Types: 1 Glasses of wine per week    Comment: occasional use~1/month   Drug use: Not Currently   Sexual activity: Defer   Social Drivers of Health   Financial Resource Strain: Low Risk  (12/01/2023)   Overall Financial Resource Strain (CARDIA)    Difficulty of Paying Living Expenses: Not very hard  Food Insecurity: Low Risk (02/22/2024)   Received from [facility]   Hunger Vital Sign    Within the past 12 months, you worried that your food would run out before you got money to buy more: Never true    Within the past 12 months, the food you bought just didn't last and you didn't have money to get more. : Never true  Transportation Needs: No Transportation Needs (02/22/2024)   Received from [facility]    In the past 12 months, has lack of reliable transportation kept you from medical appointments, meetings, work or from getting things needed for daily  living? : No  Housing Stability: Low Risk (02/22/2024)   Received from [facility] Stability Vital Sign    What is your living situation today?: I have a steady place  to live    Think about the place you live. Do you have problems with any of the following? Choose all that apply:: None/None on this list     ALLERGIES: Allergies  Allergen Reactions   Morphine Rash      CURRENT MEDICATIONS: Current Medications     Your Medication List       * Accurate as of February 29, 2024  1:20 PM. Always use your most recent med list.            Instructions 8 am 12 pm 5 pm 8 pm Notes  cefixime 400 mg capsule Commonly known as: SUPRAX  Take 1 capsule (400mg ) by mouth daily starting 2 days prior to the start of chemotherapy, and continue through Day 8 (10 days total)             cetirizine 10 MG tablet Commonly known as: ZyrTEC  Take 1 tablet (10 mg total) by mouth once daily             dexAMETHasone  4 MG tablet Commonly known as: DECADRON   Take 2 tablets (8mg ) by mouth in the morning for 2 days after chemotherapy, then as directed.             diphenoxylate -atropine  2.5-0.025 mg tablet Commonly known as: LOMOTIL   Take 1 tablet by mouth 4 (four) times daily as needed for Diarrhea             duke's magic mouthwash  Swish and spit 5 mLs every 6 (six) hours as needed             fluticasone  propionate 50 mcg/actuation nasal spray Commonly known as: FLONASE   Place 2 sprays into both nostrils once daily             loperamide  2 mg capsule Commonly known as: IMODIUM   Take 1 capsule (2 mg total) by mouth 4 (four) times daily as needed for Diarrhea             LORazepam 0.5 MG tablet Commonly known as: ATIVAN  Take 0.5 mg by mouth at bedtime as needed             methocarbamoL 750 MG tablet Commonly known as: ROBAXIN  Take 1 tablet (750 mg total) by mouth 3 (three) times daily as needed             methylphenidate   HCl 5 MG tablet Commonly known as: RITALIN   Take 1 tablet (5 mg total) by mouth once daily             naloxone 4 mg/actuation nasal spray Commonly known as: NARCAN  For suspected opioid overdose, call 911 and give a single spray into ONE nostril. If no or minimal response after 3 minutes, give another spray in OTHER nostril.             nystatin 100,000 unit/mL suspension Commonly known as: MYCOSTATIN              OLANZapine 5 MG tablet Commonly known as: ZyPREXA  Take 1 tablet (5 mg total) by mouth at bedtime for 90 days             ondansetron  8 MG tablet Commonly known as: ZOFRAN   Take 1 tablet (8 mg total) by mouth every 8 (eight) hours as needed for Nausea             ondansetron  8 MG tablet Commonly known as: ZOFRAN   Take 1 tablet (8mg ) by mouth  twice daily on Days 6 & 7, then take 1 tablet every 8 hours as needed for Nausea and vomiting             oxyCODONE  10 mg immediate release tablet Commonly known as: OxyIR  Take 1 tablet (10 mg total) by mouth every 4 (four) hours as needed for Pain             pegfilgrastim 6 mg/0.6 mL injection Commonly known as: NEULASTA  Inject 6mg  SQ between 24-72 hours after chemotherapy ends. Chemotherapy cycle = 21 days.             polyethylene glycol powder Commonly known as: MIRALAX   Take 17 g by mouth once daily Mix in 4-8ounces of fluid prior to taking.             potassium chloride  20 MEQ ER tablet Commonly known as: KLOR-CON  M20  Take 2 tablets (40 mEq total) by mouth 2 (two) times daily             prochlorperazine  10 MG tablet Commonly known as: COMPAZINE   Take 1 tablet (10 mg total) by mouth every 8 (eight) hours as needed             SENNA 8.6 mg tablet Generic drug: sennosides  Take 2 tablets by mouth once daily Available OTC             sertraline  100 MG tablet Commonly known as: ZOLOFT   Take 100 mg by mouth once daily             sodium  phosphate-potassium phosphate  250 mg tablet Commonly known as: K-PHOS NEUTRAL  Take 3 tablets by mouth 2 (two) times daily for 90 days             temozolomide 100 MG capsule Commonly known as: TEMODAR  Take 200mg  (2 capsules) PO on Days 1-5 of a 21-day cycle. Administer 60 minutes prior to irinotecan.             temozolomide 100 MG capsule Commonly known as: TEMODAR  Take 200mg  (2 capsules) PO on Days 1-5 of a 21-day cycle. Administer 60 minutes prior to irinotecan.             temozolomide 100 MG capsule Commonly known as: TEMODAR  Take 200mg  (2 capsules) PO on Days 1-4 of a 21-day cycle. Administer 60 minutes prior to irinotecan.             temozolomide 100 MG capsule Commonly known as: TEMODAR  Take 200mg  (2 capsules) PO on Days 1-4 of a 21-day cycle. Administer 60 minutes prior to irinotecan.             temozolomide 100 MG capsule Commonly known as: TEMODAR  Take 200mg  (2 capsules) PO on Day 5 of a 21-day cycle. Administer 60 minutes prior to irinotecan.             ZEPBOUND 5 mg/0.5 mL pen injector Generic drug: tirzepatide                   PHYSICAL EXAM: 177.8 cm (5' 10) 177.8 cm (5' 10)     Vitals:   02/29/24 1126  BP: 111/76  Resp: 16  Temp: 37.1 C (98.7 F)  TempSrc: Oral  SpO2: 94%  Weight: 99 kg (218 lb 4.1 oz)  PainSc: 0-No pain       PS of 1.  General Appearance:    Alert, cooperative, no distress, appears well; face  swollen moon face c/w chronic steroid use  Skin:   Head:    No rashes or lesions.  Normocephalic, without obvious abnormality, atraumatic  Eyes: Musculoskeletal:    Conjunctiva/corneas clear, EOM's intact  Some tenderness to palpation of right shoulder  Back:     Symmetric, no curvature  Lungs:     Clear to auscultation bilaterally, no wheezes or rales.   Chest wall:    No tenderness or deformity  Heart:    Tachycardic, regular rhythm, no murmur, rub  or gallop, no edema  Abdomen:   Soft,  non-tender, and non-distended, no masses, no hepatosplenomegaly.   Psych:   Mood euthymic, affect appropriate  Neurologic:   Alert and oriented x 3. No focal neuro deficit      LABORATORY STUDIES:  reviewed in epic   PATHOLOGY: Results       Procedure Component Value Units Date/Time    Pathology - General / Other [[id number]] Collected: 12/18/21 1709    Specimen: Tissue-Pathology Updated: 12/23/21 1407      Case Report --      Surgical Pathology                                Case: [id number]                                Authorizing Provider:  Valli Dickey Fitch, MD   Collected:           12/18/2021 1709             Ordering Location:     [facility]    Received:            12/19/2021 9185                                    Periop                                                                      Pathologist:           Janina Dal Shape, MD                                                     Specimen:    Tissue, Left parietal pleura short stitch marks superior long stitch marks lateral           DIAGNOSIS --      A. Pleura, left parietal, pleurectomy: - Patchy pleural fibrosis with marked hemosiderin deposition, and focal areas of recent hemorrhage - Focal reactive mesothelium, and minimal chronic inflammation - No acute inflammation or malignancy identified        Clinical Information --      Metastatic Ewing sarcoma s/p therapy.   Chest CT scan lung findings (11/22/21):  No pneumothorax. No pleural effusions. Similar appearance of reticular opacities in the right lower lobe. Persistent right lung base  posterior nodularity. Similar appearance of posterior pleural thickening in the left lobe, which is  decreased from more remote priors such as 06/10/2021. Slightly more linear  area of atelectasis in the left lung base.   Intraoperative findings: Dense adhesions of lung to the diaphragm and entire chest way requiring extensive adhesiolysis.                RADIOLOGY None new.   ASSESSMENT AND PLAN: 28 y.o. male with a diagnosis of Ewing sarcoma of RIGHT femur in 2010 s/p VDC/IE, with LEFT pleural and lymph node metastatic disease     Metastatic Ewing sarcoma with pulmonary involvement   - interval mild progression on CT at last visit - discussed continuing with VIT vs. Switch to TKI (e.g. regorafenib) - interested in trial but per my d/w contact at [facility] currently no particularly attractive mEwing trials other than tissue agnostic sarcoma trials. He is specifically interested in considering trials in [place] but would consider elsewhere - if ongoing PD may switch to TKI next restaging but for now since he has better strategies for management of diarrhea, will cont with VIT  Diarrhea - encouraged patient to take up to 16 mg (8 tablets) daily of Imodium ; 2 tablets initially, then 2 more tablets after every subsequent bowel movement - continue Lomotil , refill provided today, 02/29/2024 - continue to monitor - maintain hydration during periods of increased diarrhea; can incorporate electrolyte packets  Back pain   - LL back pain that feels like muscle strain. Beyond cancer, no other red flag symptoms. Pain well controlled with voltaren, heat packs, and oxycodone . - MRI Lumbar Spine completed 12/24/2023 reveals tandem compression fx T12, L1 & L2, new compared to prior. Active edema at L1 & T12. 2 small marrow lesions at T12 and L4, nonspecific; metastatic disease unlikely but not excluded.    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 Dasie - encouraged patient to let us  know if he would like to see Dr. Charlotta sooner than his scheduled appt on 1/12 due to increased pain medication needs. Patient would like to wait and see how the next couple of weeks goes.  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

## 2024-02-21 NOTE — Progress Notes (Signed)
 [facility] Health Maintenance Visit Name: Edwin Cook  Date of Birth: 11-08-1995 MRN: [id number] Visit Date: 02/22/2024  Subjective:    Edwin Cook is a 28 y.o. male who presents for health maintenance visit and follow up.    History of Present Illness The patient presents for a physical exam.  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 - He has been receiving chemotherapy every 3 weeks, Monday through Friday, with a 2-week break between cycles. - This regimen has resulted in significant gastrointestinal distress, including diarrhea lasting 2 to 2.5 weeks and difficulty eating, 16 pound weight loss since August - He is scheduled to transition to a different oral chemotherapy regimen in 3 weeks and is considering participation in clinical trials. - He has a port in place and undergoes lab work every 3 weeks at [facility]. - He has been inactive for the past 2 months due to fatigue and plans to start physical therapy once his condition stabilizes. - He has a strong support system in place, including friends and his mother.  MRI lumbar spine without contrast from 12/24/2023 IMPRESSION: 1. Tandem compression fractures T12, L1, and L2 (Schmorl's  node) as described, new when compared to prior CT scan April 11, 2023,  with some evidence of active edema at L1 and to a lesser extent at T12.  2. 2 small marrow lesions at T12 and L4 vertebra as described, ultimately  nonspecific. Metastatic disease is considered unlikely but not excluded.  Ultimately, depending on the clinical circumstances, follow-up is  recommended.   Chest CT without contrast 02/08/2024 showed interval increase in size and number of several pulmonary nodules concerning for worsening metastatic disease and an overall stable left lower lobe consolidative mass with associated pleural lesion which may reflect treatment results  and changes, but residual disease is not excluded.  Anemia - He has been experiencing anemia, which is being monitored by his hematologist. - He has never received an iron infusion. - He does not take B12 supplements. - Hemoglobin 10.0 on 02/08/2024  Weight Loss - He has been experiencing weight loss since 10/2023, with a total loss of 16 pounds.  Back Pain - He has been experiencing back pain localized to the area of his tumor, which has been persistent for several weeks.  PrEP - Started PrEP last year, but is not currently taking it as he is not sexually active   Depression and anxiety -Continues to take sertraline  100 mg daily which is working well - Has Ativan for as needed use  Health maintenance:   PSA: N/A Colonoscopy: Never Bone density: Eye Exam: UTD Hearing test: Tdap: 11/21/2006, thinks updated at student health < 10 years ago HPV vaccines: UTD Prevnar 20: Flu vaccine: UTD Covid-19 vaccines:  RSV: N/A Hep C screening: Has been done HIV screening: Has been done STI screening: Declined  Dental:  UTD  Review of systems is positive for diarrhea, back pain, fatigue and weight loss   Past medical history, social history, family history, allergies, and medications reviewed and updated.    Assessment & Plan 1. Anemia: - Currently monitored by hematologist - No iron infusions administered - B12 levels checked 2 months ago, significantly high above 4000 - Further monitoring as per hematologist's recommendations  2. Elevated liver function tests: - Liver function tests slightly elevated - Continued monitoring by oncology team  3. Weight loss: - Experienced weight loss of  16 pounds since August - Likely due to current chemotherapy regimen causing gastrointestinal side effects (diarrhea, reduced appetite) - Scheduled to switch to a different oral chemotherapy regimen in about 3 weeks  4. Back pain: - Persistent back pain localized to the area of the tumor -  Continue NSAIDs, heating pad, oxycodone  as needed  5. Tachycardia: - Pulse rate noted to be 114 during visit - Attributed to dehydration  - Resting heart rate at home usually in the 70s or 80s  6.  Depression and anxiety: - On sertraline  100 mg for mood management - Reports doing well on medication - Continued follow-up with mental health provider recommended  7.  Ewing's sarcoma - Followed by oncology at [facility] - Getting ready to start new chemotherapy regimen  8. Health maintenance: - Received influenza vaccine on the second of this month - Decided against further COVID-19 vaccinations - Last Tdap updated less than 10 years ago - Screened for hepatitis C and HIV in the past - Eye and dental exams up to date - No need for STI screening at this time - TSH and lipid panel to be ordered today (cholesterol and thyroid levels not checked in over a year)  Follow-up: - Patient to follow up in 1 year    Health maintenance:  Immunizations are reviewed as noted. Healthy lifestyle principles reviewed (do not smoke; BMI < 30; at least 150 minutes of aerobic exercise per week; 5 servings of fruits or vegetables daily). Today's orders noted below.   Return in about 1 year (around 02/21/2025) for CPE.   Health Maintenance Status       Date Due Completion Dates   Pneumococcal Vaccine: Pediatrics (0 to 5 years) and At-Risk Patients (6-49 Years) (1 of 2 - PCV) Never done ---   COVID-19 Vaccine (4 - 2025-26 season) 02/21/2025 (Originally 11/16/2023) 12/06/2019, 06/23/2019   DTaP/Tdap/Td Vaccines (7 - Td or Tdap) 03/17/2025 (Originally 11/20/2016) 11/21/2006, 12/22/2000   Depression Monitoring 08/22/2024 02/22/2024   Diabetes Screening 10/15/2024 10/16/2023, 04/10/2023   Comprehensive Annual Visit 02/21/2025 02/22/2024, 02/20/2023   Adult RSV (50+ Years or Pregnancy) (1 - 1-dose 75+ series) 01/08/2071 ---      Immunization History  Administered Date(s) Administered   DTaP vaccine(INFANRIX)[id number]  03/14/1996, 05/12/1996, 07/11/1996, 04/11/1997   Dtap, Unspecified 12/22/2000   HPV, Unspecified 01/12/2012, 03/05/2012, 07/12/2012   Hep A, Unspecified 08/23/2007, 02/25/2008   Hep B, Adolescent or Pediatric 07/22/2017   Hep B, Unspecified 07/28/1995, 03/14/1996, 07/11/1996   Hepatitis B Vaccine 07/22/2017   Hib (HbOC) 03/14/1996, 05/12/1996, 07/11/1996, 01/10/1997   IPV 03/14/1996, 05/12/1996, 01/10/1997   Influenza, Injectable, Mdck, Preservative Free,quadrivalent 03/18/2017, 01/28/2022   Influenza, Injectable, Quadrivalent, Preservative Free 11/30/2019   Influenza, Unspecified 01/07/2011, 12/31/2022, 01/17/2024   Influenza, split virus, trivalent, preservative 01/07/2011, 11/30/2017, 12/13/2018   Influenza,split virus, trivalent, PF 11/30/2017   MMR 04/11/1997, 12/22/2000   Meningococcal ACWY, Unspecified 01/11/2010   Moderna SARS-CoV-2 Primary Series 12+ yrs 05/20/2019, 06/23/2019, 12/06/2019   PPD Test 12/20/2010   Polio, Unspecified 12/22/2000   TDAP VACCINE (BOOSTRIX,ADACEL) 7Y+ 11/21/2006   Varicella SQ (VARIVAX) 1Y+ 12/22/2000, 11/21/2006    Orders Placed This Encounter  Procedures   TSH With Reflex To Free T4   Lipid Panel   No orders of the defined types were placed in this encounter.    Objective:  BP 103/75   Pulse (!) 114   Temp 96.7 F (35.9 C) (Temporal)   Ht 1.778 m (5' 10)   Wt 100 kg (220 lb  8 oz)   SpO2 99%   BMI 31.64 kg/m   Physical Exam:  General appearance: Alert, in no apparent distress, pleasant Skin: No rashes or abnormal appearing lesions Head: Normocephalic and atraumatic Eyes: PERRLA, EOMs intact, fundi normal, conjunctiva clear Ears: Ear canals normal, TMs pearly gray and intact. Nose: No discharge Oropharynx: Pink and moist without exudate. Neck: Supple without lymphadenopathy, thyromegaly or carotid bruit. Heart: Regular rate and rhythm.  No murmurs, rubs or gallops. Lungs: Clear to auscultation bilaterally  without wheezing, rales or rhonchi. Abdomen: Soft and nontender.  Bowel sounds present and normoactive in 4 quadrants.  No masses or organomegaly GU: Declined Extremities: No edema. Neurologic: CN II through XII grossly intact.  Muscle strength 5 out of 5 and reflexes 2+ and symmetric in the upper and lower extremities bilaterally     Problem List[1]   Medical History[2]  Surgical History[3]  Current Outpatient Medications  Medication Instructions   ceFIXime 400 mg capsule Daily   cetirizine (ZYRTEC) 10 mg, Daily   cyclobenzaprine  (FLEXERIL ) 5 mg, oral, 3 times daily PRN   fluoride, sodium, 1.1 % pste Apply a pea sized amoutn of the paste to the toothbrush and brush thoroughly for 2 minutes, preferable at bedtime.   loperamide  (IMODIUM ) 2 mg, 4 times daily PRN   LORazepam (ATIVAN) 0.5 mg, oral, Daily PRN   methylphenidate  (RITALIN ) 5 mg, Daily   ondansetron  (ZOFRAN ) 8 mg tablet Take 1 tablet (8 mg total) by mouth every 8 (eight) hours as needed for Nausea   oxyCODONE  (ROXICODONE ) 10 mg tab Take 1 tablet (10 mg total) by mouth every 4 (four) hours as needed for Pain   polyethylene glycol (MIRALAX ) 17 gram powd powder Take 17 g by mouth once daily Mix in 4-8ounces of fluid prior to taking.   potassium chloride  20 mEq ER tablet 40 mEq, oral, 2 times daily   potassium phosphate -sodium phosphate (K-Phos-NeutraL) 250 mg tablet Take 2 tablets by mouth 2 (two) times daily for 90 days   senna (SENOKOT) 8.6 mg tablet 2 tablets   sertraline  (ZOLOFT ) 100 mg, oral, Daily   temozolomide (TEMODAR) 100 mg chemo capsule Take 200mg  (2 capsules) PO on Days 1-5 of a 21-day cycle. Administer 60 minutes prior to irinotecan.   temozolomide (TEMODAR) 100 mg chemo capsule Take 200mg  (2 capsules) PO on Days 1-4 of a 21-day cycle. Administer 60 minutes prior to irinotecan.   temozolomide (TEMODAR) 100 mg chemo capsule Take 200mg  (2 capsules) PO on Days 1-5 of a 21-day cycle. Administer 60  minutes prior to irinotecan.    Allergies[4]  Social History[5] Social History   Social History Narrative   Single. 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     Family History: family history includes ADD / ADHD in his brother; Arthritis in his paternal grandfather; Asthma in his brother and father; Bipolar disorder in his paternal aunt; COPD in his father, paternal grandfather, and paternal grandmother; Cancer in his paternal grandmother; Cataracts in his maternal grandmother; Diabetes in his brother, maternal grandmother, and mother; Hearing loss in his paternal grandfather; Hyperlipidemia in his mother; Hypertension in his mother; Mental illness in his paternal aunt; OCD in his mother.  Review of Systems  A stated in the HPI.   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, left (CMD)   Obesity   Ewing's sarcoma    (CMD)   Status post right hip replacement   Leg length difference, acquired   Hip pain, right   Major depressive disorder with single episode, in partial remission   Metatarsal stress fracture of right foot with delayed healing   Anxiety   ADHD (attention deficit hyperactivity disorder), inattentive type   Large pleural effusion   Skin exam, screening for cancer   Anemia   Skin inflammation   Need for pneumocystis prophylaxis   Bilateral myopia   History of pulmonary embolus (PE)  [2] Past Medical History: Diagnosis Date   Acute pulmonary embolism    (CMD) 07/09/2020   ADHD (attention deficit hyperactivity disorder), inattentive type 10/22/2017   Anxiety 05/19/2017   Depression    Ewing's sarcoma of femur    (CMD)    08/2008   Folliculitis 08/10/2020   History of chemotherapy    Obesity 07/25/2011   Obsessive-compulsive  disorder    Pain, joint, knee, left 05/11/2012   Primary Ewing's sarcoma of bone    (CMD)    Right foot pain 05/09/2014   Skin inflammation 08/10/2020  [3] Past Surgical History: Procedure Laterality Date   BONE MARROW BIOPSY W/ ASPIRATION  July 2010   Procedure: BONE MARROW BIOPSY W/ ASPIRATION   CLEFT LIP REPAIR     Procedure: CLEFT LIP REPAIR   INCISION AND DRAINAGE ABSCESS / HEMATOMA OF BURSA / KNEE / THIGH  December 2010   Procedure: INCISION AND DRAINAGE ABSCESS / HEMATOMA OF BURSA / KNEE / THIGH   LUNG DECORTICATION Left 08/19/2019   Procedure: VATS / DECORTICATION LUNG, PLEURAL BIOPSIES;  Surgeon: Nat Earnie Pringle, MD;  Location: [facility] MAIN OR;  Service: Cardiothoracic;  Laterality: Left;   OTHER SURGICAL HISTORY  January 2010   Procedure: OTHER SURGICAL HISTORY (orthoscopic R knee)   OTHER SURGICAL HISTORY  July 2010, removed May 2011   Procedure: OTHER SURGICAL HISTORY (Port-a-Cath)   OTHER SURGICAL HISTORY  June 2010   Procedure: OTHER SURGICAL HISTORY (Biospy of Knee)   PARTIAL HIP ARTHROPLASTY  October 2010   Procedure: PARTIAL HIP ARTHROPLASTY; took out tumor, replaced with rod; partial hip replacement  [4] Allergies Allergen Reactions   Morphine Rash  [5] Social History Tobacco Use   Smoking status: Former    Current packs/day: 0.00    Types: Cigarettes    Quit date: 12/15/2017    Years since quitting: 6.2   Smokeless tobacco: Never  Substance Use Topics   Alcohol use: Yes   Drug use: No

## 2024-02-22 DIAGNOSIS — Z7689 Persons encountering health services in other specified circumstances: Secondary | ICD-10-CM | POA: Diagnosis not present

## 2024-02-26 NOTE — Progress Notes (Signed)
 Corrected temodar prescription adding back day 5

## 2024-03-01 NOTE — Progress Notes (Signed)
 C4 D1 of Irinotecan, Temodar, and Vincristine   Nursing Assessment Neurological- alert and oriented x4 Cardiopulmonary- no issues Genitourinary- no issues Gastrointestinal- no issues Skin- no new rashes, wounds, or areas of concern Psychosocial- no issues Nutritional- no issues Fatigue level- 4 Oral mucositis- 0 Pain score- 9 (10mg  oxycodone  given per MAR order) Pain down to 1/10  See flowsheet for falls assessment, IV assessment, and vital signs.   Access - Port PTA - Patient arrived accessed. Dressing and site clean, dry and intact. Blood return noted prior to and post infusion. Patient de-accessed per protocol  Patient has no additional therapy plan Patient has no orders for later Patient has no upcoming filgrastim  shot  Patient tolerated treatment well and was discharged from treatment room.
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
## 2024-03-10 ENCOUNTER — Inpatient Hospital Stay (HOSPITAL_COMMUNITY)

## 2024-03-10 ENCOUNTER — Inpatient Hospital Stay (HOSPITAL_COMMUNITY): Admitting: Anesthesiology

## 2024-03-10 ENCOUNTER — Other Ambulatory Visit: Payer: Self-pay

## 2024-03-10 ENCOUNTER — Inpatient Hospital Stay (HOSPITAL_COMMUNITY)
Admission: EM | Admit: 2024-03-10 | Discharge: 2024-03-17 | DRG: 871 | Disposition: E | Attending: Pulmonary Disease | Admitting: Pulmonary Disease

## 2024-03-10 ENCOUNTER — Emergency Department (HOSPITAL_COMMUNITY)

## 2024-03-10 DIAGNOSIS — C4022 Malignant neoplasm of long bones of left lower limb: Secondary | ICD-10-CM | POA: Diagnosis not present

## 2024-03-10 DIAGNOSIS — N179 Acute kidney failure, unspecified: Secondary | ICD-10-CM | POA: Diagnosis present

## 2024-03-10 DIAGNOSIS — F17290 Nicotine dependence, other tobacco product, uncomplicated: Secondary | ICD-10-CM | POA: Diagnosis present

## 2024-03-10 DIAGNOSIS — D6959 Other secondary thrombocytopenia: Secondary | ICD-10-CM | POA: Diagnosis present

## 2024-03-10 DIAGNOSIS — E8729 Other acidosis: Secondary | ICD-10-CM | POA: Diagnosis not present

## 2024-03-10 DIAGNOSIS — C419 Malignant neoplasm of bone and articular cartilage, unspecified: Secondary | ICD-10-CM | POA: Diagnosis present

## 2024-03-10 DIAGNOSIS — D701 Agranulocytosis secondary to cancer chemotherapy: Secondary | ICD-10-CM | POA: Diagnosis not present

## 2024-03-10 DIAGNOSIS — I3139 Other pericardial effusion (noninflammatory): Secondary | ICD-10-CM | POA: Diagnosis present

## 2024-03-10 DIAGNOSIS — J9602 Acute respiratory failure with hypercapnia: Secondary | ICD-10-CM | POA: Diagnosis present

## 2024-03-10 DIAGNOSIS — Z85831 Personal history of malignant neoplasm of soft tissue: Secondary | ICD-10-CM | POA: Diagnosis not present

## 2024-03-10 DIAGNOSIS — R6521 Severe sepsis with septic shock: Secondary | ICD-10-CM | POA: Diagnosis present

## 2024-03-10 DIAGNOSIS — C3490 Malignant neoplasm of unspecified part of unspecified bronchus or lung: Secondary | ICD-10-CM | POA: Diagnosis not present

## 2024-03-10 DIAGNOSIS — E872 Acidosis, unspecified: Secondary | ICD-10-CM | POA: Diagnosis present

## 2024-03-10 DIAGNOSIS — D709 Neutropenia, unspecified: Secondary | ICD-10-CM | POA: Diagnosis not present

## 2024-03-10 DIAGNOSIS — Z8349 Family history of other endocrine, nutritional and metabolic diseases: Secondary | ICD-10-CM

## 2024-03-10 DIAGNOSIS — A419 Sepsis, unspecified organism: Secondary | ICD-10-CM | POA: Diagnosis not present

## 2024-03-10 DIAGNOSIS — R739 Hyperglycemia, unspecified: Secondary | ICD-10-CM

## 2024-03-10 DIAGNOSIS — R197 Diarrhea, unspecified: Secondary | ICD-10-CM | POA: Diagnosis not present

## 2024-03-10 DIAGNOSIS — R918 Other nonspecific abnormal finding of lung field: Secondary | ICD-10-CM | POA: Diagnosis not present

## 2024-03-10 DIAGNOSIS — Z825 Family history of asthma and other chronic lower respiratory diseases: Secondary | ICD-10-CM

## 2024-03-10 DIAGNOSIS — R5081 Fever presenting with conditions classified elsewhere: Secondary | ICD-10-CM

## 2024-03-10 DIAGNOSIS — C4021 Malignant neoplasm of long bones of right lower limb: Secondary | ICD-10-CM | POA: Diagnosis not present

## 2024-03-10 DIAGNOSIS — J9601 Acute respiratory failure with hypoxia: Secondary | ICD-10-CM | POA: Diagnosis present

## 2024-03-10 DIAGNOSIS — Z79899 Other long term (current) drug therapy: Secondary | ICD-10-CM

## 2024-03-10 DIAGNOSIS — A4152 Sepsis due to Pseudomonas: Principal | ICD-10-CM | POA: Diagnosis present

## 2024-03-10 DIAGNOSIS — T451X5A Adverse effect of antineoplastic and immunosuppressive drugs, initial encounter: Secondary | ICD-10-CM | POA: Diagnosis present

## 2024-03-10 DIAGNOSIS — R509 Fever, unspecified: Secondary | ICD-10-CM | POA: Diagnosis present

## 2024-03-10 DIAGNOSIS — Z885 Allergy status to narcotic agent status: Secondary | ICD-10-CM

## 2024-03-10 DIAGNOSIS — Z66 Do not resuscitate: Secondary | ICD-10-CM | POA: Diagnosis not present

## 2024-03-10 DIAGNOSIS — Z22322 Carrier or suspected carrier of Methicillin resistant Staphylococcus aureus: Secondary | ICD-10-CM

## 2024-03-10 DIAGNOSIS — J151 Pneumonia due to Pseudomonas: Secondary | ICD-10-CM | POA: Diagnosis present

## 2024-03-10 DIAGNOSIS — I4891 Unspecified atrial fibrillation: Secondary | ICD-10-CM | POA: Diagnosis not present

## 2024-03-10 DIAGNOSIS — R0609 Other forms of dyspnea: Secondary | ICD-10-CM

## 2024-03-10 DIAGNOSIS — J189 Pneumonia, unspecified organism: Secondary | ICD-10-CM | POA: Diagnosis not present

## 2024-03-10 DIAGNOSIS — Z8249 Family history of ischemic heart disease and other diseases of the circulatory system: Secondary | ICD-10-CM

## 2024-03-10 DIAGNOSIS — J90 Pleural effusion, not elsewhere classified: Secondary | ICD-10-CM | POA: Diagnosis not present

## 2024-03-10 DIAGNOSIS — D61810 Antineoplastic chemotherapy induced pancytopenia: Secondary | ICD-10-CM | POA: Diagnosis present

## 2024-03-10 DIAGNOSIS — E871 Hypo-osmolality and hyponatremia: Secondary | ICD-10-CM | POA: Diagnosis present

## 2024-03-10 DIAGNOSIS — D696 Thrombocytopenia, unspecified: Secondary | ICD-10-CM

## 2024-03-10 DIAGNOSIS — Z4682 Encounter for fitting and adjustment of non-vascular catheter: Secondary | ICD-10-CM | POA: Diagnosis not present

## 2024-03-10 DIAGNOSIS — C7802 Secondary malignant neoplasm of left lung: Secondary | ICD-10-CM | POA: Diagnosis present

## 2024-03-10 DIAGNOSIS — D702 Other drug-induced agranulocytosis: Secondary | ICD-10-CM | POA: Diagnosis present

## 2024-03-10 DIAGNOSIS — T50905A Adverse effect of unspecified drugs, medicaments and biological substances, initial encounter: Secondary | ICD-10-CM | POA: Diagnosis not present

## 2024-03-10 DIAGNOSIS — E876 Hypokalemia: Secondary | ICD-10-CM | POA: Diagnosis present

## 2024-03-10 DIAGNOSIS — I493 Ventricular premature depolarization: Secondary | ICD-10-CM | POA: Diagnosis not present

## 2024-03-10 DIAGNOSIS — K7200 Acute and subacute hepatic failure without coma: Secondary | ICD-10-CM | POA: Diagnosis present

## 2024-03-10 DIAGNOSIS — Z1152 Encounter for screening for COVID-19: Secondary | ICD-10-CM | POA: Diagnosis not present

## 2024-03-10 DIAGNOSIS — D649 Anemia, unspecified: Secondary | ICD-10-CM

## 2024-03-10 DIAGNOSIS — Z515 Encounter for palliative care: Secondary | ICD-10-CM | POA: Diagnosis not present

## 2024-03-10 DIAGNOSIS — E878 Other disorders of electrolyte and fluid balance, not elsewhere classified: Secondary | ICD-10-CM | POA: Diagnosis not present

## 2024-03-10 DIAGNOSIS — Z833 Family history of diabetes mellitus: Secondary | ICD-10-CM

## 2024-03-10 DIAGNOSIS — Z452 Encounter for adjustment and management of vascular access device: Secondary | ICD-10-CM | POA: Diagnosis not present

## 2024-03-10 LAB — CBC WITH DIFFERENTIAL/PLATELET
Abs Immature Granulocytes: 0.04 K/uL (ref 0.00–0.07)
Abs Immature Granulocytes: 0.05 K/uL (ref 0.00–0.07)
Basophils Absolute: 0 K/uL (ref 0.0–0.1)
Basophils Absolute: 0 K/uL (ref 0.0–0.1)
Basophils Relative: 0 %
Basophils Relative: 0 %
Eosinophils Absolute: 0 K/uL (ref 0.0–0.5)
Eosinophils Absolute: 0 K/uL (ref 0.0–0.5)
Eosinophils Relative: 0 %
Eosinophils Relative: 0 %
HCT: 26.9 % — ABNORMAL LOW (ref 39.0–52.0)
HCT: 26.5 % — ABNORMAL LOW (ref 39.0–52.0)
Hemoglobin: 8.6 g/dL — ABNORMAL LOW (ref 13.0–17.0)
Hemoglobin: 8.7 g/dL — ABNORMAL LOW (ref 13.0–17.0)
Immature Granulocytes: 10 %
Immature Granulocytes: 13 %
Lymphocytes Relative: 47 %
Lymphocytes Relative: 32 %
Lymphs Abs: 0.2 K/uL — ABNORMAL LOW (ref 0.7–4.0)
Lymphs Abs: 0.1 K/uL — ABNORMAL LOW (ref 0.7–4.0)
MCH: 31.4 pg (ref 26.0–34.0)
MCH: 31.3 pg (ref 26.0–34.0)
MCHC: 32 g/dL (ref 30.0–36.0)
MCHC: 32.8 g/dL (ref 30.0–36.0)
MCV: 98.2 fL (ref 80.0–100.0)
MCV: 95.3 fL (ref 80.0–100.0)
Monocytes Absolute: 0.1 K/uL (ref 0.1–1.0)
Monocytes Absolute: 0.1 K/uL (ref 0.1–1.0)
Monocytes Relative: 21 %
Monocytes Relative: 29 %
Neutro Abs: 0.1 K/uL — CL (ref 1.7–7.7)
Neutro Abs: 0.1 K/uL — CL (ref 1.7–7.7)
Neutrophils Relative %: 22 %
Neutrophils Relative %: 26 %
Platelets: 110 K/uL — ABNORMAL LOW (ref 150–400)
Platelets: 87 K/uL — ABNORMAL LOW (ref 150–400)
RBC: 2.74 MIL/uL — ABNORMAL LOW (ref 4.22–5.81)
RBC: 2.78 MIL/uL — ABNORMAL LOW (ref 4.22–5.81)
RDW: 14.7 % (ref 11.5–15.5)
RDW: 14.6 % (ref 11.5–15.5)
WBC: 0.4 K/uL — CL (ref 4.0–10.5)
WBC: 0.4 K/uL — CL (ref 4.0–10.5)
nRBC: 7.1 % — ABNORMAL HIGH (ref 0.0–0.2)
nRBC: 5.3 % — ABNORMAL HIGH (ref 0.0–0.2)

## 2024-03-10 LAB — MAGNESIUM: Magnesium: 1.2 mg/dL — ABNORMAL LOW (ref 1.7–2.4)

## 2024-03-10 LAB — COMPREHENSIVE METABOLIC PANEL WITH GFR
ALT: 32 U/L (ref 0–44)
ALT: 30 U/L (ref 0–44)
AST: 19 U/L (ref 15–41)
AST: 22 U/L (ref 15–41)
Albumin: 2.5 g/dL — ABNORMAL LOW (ref 3.5–5.0)
Albumin: 2.5 g/dL — ABNORMAL LOW (ref 3.5–5.0)
Alkaline Phosphatase: 86 U/L (ref 38–126)
Alkaline Phosphatase: 72 U/L (ref 38–126)
Anion gap: 12 (ref 5–15)
Anion gap: 13 (ref 5–15)
BUN: <5 mg/dL — ABNORMAL LOW (ref 6–20)
BUN: 7 mg/dL (ref 6–20)
CO2: 19 mmol/L — ABNORMAL LOW (ref 22–32)
CO2: 15 mmol/L — ABNORMAL LOW (ref 22–32)
Calcium: 7.3 mg/dL — ABNORMAL LOW (ref 8.9–10.3)
Calcium: 7.2 mg/dL — ABNORMAL LOW (ref 8.9–10.3)
Chloride: 102 mmol/L (ref 98–111)
Chloride: 104 mmol/L (ref 98–111)
Creatinine, Ser: 1.09 mg/dL (ref 0.61–1.24)
Creatinine, Ser: 1.07 mg/dL (ref 0.61–1.24)
GFR, Estimated: >60 mL/min
GFR, Estimated: >60 mL/min
Glucose, Bld: 132 mg/dL — ABNORMAL HIGH (ref 70–99)
Glucose, Bld: 132 mg/dL — ABNORMAL HIGH (ref 70–99)
Potassium: 3.9 mmol/L (ref 3.5–5.1)
Potassium: 3.7 mmol/L (ref 3.5–5.1)
Sodium: 133 mmol/L — ABNORMAL LOW (ref 135–145)
Sodium: 132 mmol/L — ABNORMAL LOW (ref 135–145)
Total Bilirubin: 0.5 mg/dL (ref 0.0–1.2)
Total Bilirubin: 0.5 mg/dL (ref 0.0–1.2)
Total Protein: 3.9 g/dL — ABNORMAL LOW (ref 6.5–8.1)
Total Protein: 4 g/dL — ABNORMAL LOW (ref 6.5–8.1)

## 2024-03-10 LAB — BLOOD GAS, ARTERIAL
Acid-base deficit: 10.4 mmol/L — ABNORMAL HIGH (ref 0.0–2.0)
Acid-base deficit: 9.8 mmol/L — ABNORMAL HIGH (ref 0.0–2.0)
Bicarbonate: 13.9 mmol/L — ABNORMAL LOW (ref 20.0–28.0)
Bicarbonate: 15.7 mmol/L — ABNORMAL LOW (ref 20.0–28.0)
Drawn by: 74501
Drawn by: 74501
FIO2: 100 %
FIO2: 80 %
MECHVT: 560 mL
MECHVT: 580 mL
O2 Saturation: 98.7 %
O2 Saturation: 99.2 %
PEEP: 5 cmH2O
PEEP: 5 cmH2O
Patient temperature: 36.5
Patient temperature: 37.6
RATE: 28 {breaths}/min
RATE: 28 {breaths}/min
pCO2 arterial: 24 mmHg — ABNORMAL LOW (ref 32–48)
pCO2 arterial: 34 mmHg (ref 32–48)
pH, Arterial: 7.27 — ABNORMAL LOW (ref 7.35–7.45)
pH, Arterial: 7.38 (ref 7.35–7.45)
pO2, Arterial: 164 mmHg — ABNORMAL HIGH (ref 83–108)
pO2, Arterial: 171 mmHg — ABNORMAL HIGH (ref 83–108)

## 2024-03-10 LAB — LACTIC ACID, PLASMA
Lactic Acid, Venous: 7.6 mmol/L (ref 0.5–1.9)
Lactic Acid, Venous: >9 mmol/L (ref 0.5–1.9)
Lactic Acid, Venous: 5.9 mmol/L (ref 0.5–1.9)

## 2024-03-10 LAB — EXPECTORATED SPUTUM ASSESSMENT W GRAM STAIN, RFLX TO RESP C

## 2024-03-10 LAB — BASIC METABOLIC PANEL WITH GFR
Anion gap: 20 — ABNORMAL HIGH (ref 5–15)
BUN: 8 mg/dL (ref 6–20)
CO2: 13 mmol/L — ABNORMAL LOW (ref 22–32)
Calcium: 7.6 mg/dL — ABNORMAL LOW (ref 8.9–10.3)
Chloride: 102 mmol/L (ref 98–111)
Creatinine, Ser: 1.18 mg/dL (ref 0.61–1.24)
GFR, Estimated: >60 mL/min
Glucose, Bld: 151 mg/dL — ABNORMAL HIGH (ref 70–99)
Potassium: 3.8 mmol/L (ref 3.5–5.1)
Sodium: 134 mmol/L — ABNORMAL LOW (ref 135–145)

## 2024-03-10 LAB — GLUCOSE, CAPILLARY: Glucose-Capillary: 154 mg/dL — ABNORMAL HIGH (ref 70–99)

## 2024-03-10 LAB — URINALYSIS, W/ REFLEX TO CULTURE (INFECTION SUSPECTED)
Bilirubin Urine: NEGATIVE
Glucose, UA: 50 mg/dL — AB
Hgb urine dipstick: NEGATIVE
Ketones, ur: NEGATIVE mg/dL
Leukocytes,Ua: NEGATIVE
Nitrite: NEGATIVE
Protein, ur: NEGATIVE mg/dL
Specific Gravity, Urine: 1.004 — ABNORMAL LOW (ref 1.005–1.030)
pH: 6 (ref 5.0–8.0)

## 2024-03-10 LAB — BLOOD GAS, VENOUS
Acid-base deficit: 13.6 mmol/L — ABNORMAL HIGH (ref 0.0–2.0)
Bicarbonate: 13.8 mmol/L — ABNORMAL LOW (ref 20.0–28.0)
O2 Saturation: 95.5 %
Patient temperature: 37.1
pCO2, Ven: 37 mmHg — ABNORMAL LOW (ref 44–60)
pH, Ven: 7.18 — CL (ref 7.25–7.43)
pO2, Ven: 72 mmHg — ABNORMAL HIGH (ref 32–45)

## 2024-03-10 LAB — ECHOCARDIOGRAM COMPLETE
Area-P 1/2: 4.9 cm2
Calc EF: 49.1 %
Height: 70 in
S' Lateral: 3.3 cm
Single Plane A2C EF: 48.7 %
Single Plane A4C EF: 51.1 %
Weight: 3506.2 [oz_av]

## 2024-03-10 LAB — RESP PANEL BY RT-PCR (RSV, FLU A&B, COVID)  RVPGX2
Influenza A by PCR: NEGATIVE
Influenza B by PCR: NEGATIVE
Resp Syncytial Virus by PCR: NEGATIVE
SARS Coronavirus 2 by RT PCR: NEGATIVE

## 2024-03-10 LAB — I-STAT CG4 LACTIC ACID, ED
Lactic Acid, Venous: 5.2 mmol/L (ref 0.5–1.9)
Lactic Acid, Venous: 8.3 mmol/L (ref 0.5–1.9)

## 2024-03-10 LAB — STREP PNEUMONIAE URINARY ANTIGEN: Strep Pneumo Urinary Antigen: NEGATIVE

## 2024-03-10 LAB — MRSA NEXT GEN BY PCR, NASAL: MRSA by PCR Next Gen: DETECTED — AB

## 2024-03-10 LAB — PHOSPHORUS: Phosphorus: 2.1 mg/dL — ABNORMAL LOW (ref 2.5–4.6)

## 2024-03-10 LAB — PROTIME-INR
INR: 1.4 — ABNORMAL HIGH (ref 0.8–1.2)
Prothrombin Time: 18.2 s — ABNORMAL HIGH (ref 11.4–15.2)

## 2024-03-10 LAB — PROCALCITONIN: Procalcitonin: 27.7 ng/mL

## 2024-03-10 LAB — CBG MONITORING, ED: Glucose-Capillary: 86 mg/dL (ref 70–99)

## 2024-03-10 LAB — HIV ANTIBODY (ROUTINE TESTING W REFLEX): HIV Screen 4th Generation wRfx: NONREACTIVE

## 2024-03-10 MED ORDER — SODIUM BICARBONATE 8.4 % IV SOLN
INTRAVENOUS | Status: AC
  Filled 2024-03-10: qty 50

## 2024-03-10 MED ORDER — HYDROCORTISONE SOD SUC (PF) 100 MG IJ SOLR
100.0000 mg | Freq: Two times a day (BID) | INTRAMUSCULAR | Status: DC
  Administered 2024-03-10 – 2024-03-13 (×6): 100 mg via INTRAVENOUS
  Filled 2024-03-10 (×6): qty 2

## 2024-03-10 MED ORDER — SERTRALINE HCL 100 MG PO TABS
100.0000 mg | ORAL_TABLET | Freq: Every day | ORAL | Status: DC
  Administered 2024-03-10: 100 mg via ORAL
  Filled 2024-03-10: qty 1

## 2024-03-10 MED ORDER — PERFLUTREN LIPID MICROSPHERE
1.0000 mL | INTRAVENOUS | Status: AC | PRN
  Administered 2024-03-10: 2 mL via INTRAVENOUS

## 2024-03-10 MED ORDER — PROCHLORPERAZINE MALEATE 10 MG PO TABS: 10.0000 mg | ORAL_TABLET | Freq: Three times a day (TID) | ORAL | Status: DC | PRN

## 2024-03-10 MED ORDER — PHENYLEPHRINE 80 MCG/ML (10ML) SYRINGE FOR IV PUSH (FOR BLOOD PRESSURE SUPPORT)
PREFILLED_SYRINGE | INTRAVENOUS | Status: AC
  Filled 2024-03-10: qty 20

## 2024-03-10 MED ORDER — LACTATED RINGERS IV BOLUS
1000.0000 mL | Freq: Once | INTRAVENOUS | Status: AC
  Administered 2024-03-10: 1000 mL via INTRAVENOUS

## 2024-03-10 MED ORDER — POTASSIUM PHOSPHATES 15 MMOLE/5ML IV SOLN
15.0000 mmol | Freq: Once | INTRAVENOUS | Status: AC
  Administered 2024-03-10: 15 mmol via INTRAVENOUS
  Filled 2024-03-10: qty 5

## 2024-03-10 MED ORDER — SERTRALINE HCL 100 MG PO TABS
100.0000 mg | ORAL_TABLET | Freq: Every day | ORAL | Status: DC
  Administered 2024-03-11 – 2024-03-12 (×2): 100 mg
  Filled 2024-03-10 (×2): qty 1

## 2024-03-10 MED ORDER — SODIUM CHLORIDE 0.9% FLUSH: 10.0000 mL | INTRAVENOUS | Status: DC | PRN

## 2024-03-10 MED ORDER — SODIUM CHLORIDE 0.9% FLUSH
10.0000 mL | Freq: Two times a day (BID) | INTRAVENOUS | Status: DC
  Administered 2024-03-10 – 2024-03-11 (×2): 10 mL
  Administered 2024-03-11: 30 mL
  Administered 2024-03-12: 20 mL

## 2024-03-10 MED ORDER — EPINEPHRINE 1 MG/10ML IV SOSY
PREFILLED_SYRINGE | INTRAVENOUS | Status: AC
  Filled 2024-03-10: qty 10

## 2024-03-10 MED ORDER — LOPERAMIDE HCL 2 MG PO CAPS
2.0000 mg | ORAL_CAPSULE | Freq: Every day | ORAL | Status: DC | PRN
  Administered 2024-03-10: 2 mg via ORAL
  Filled 2024-03-10: qty 1

## 2024-03-10 MED ORDER — DIPHENOXYLATE-ATROPINE 2.5-0.025 MG PO TABS: 1.0000 | ORAL_TABLET | Freq: Four times a day (QID) | ORAL | Status: DC | PRN

## 2024-03-10 MED ORDER — NOREPINEPHRINE 4 MG/250ML-% IV SOLN
0.0000 ug/min | INTRAVENOUS | Status: DC
  Administered 2024-03-10: 2 ug/min via INTRAVENOUS
  Administered 2024-03-10: 10 ug/min via INTRAVENOUS
  Administered 2024-03-10: 40 ug/min via INTRAVENOUS
  Filled 2024-03-10 (×3): qty 250

## 2024-03-10 MED ORDER — FAMOTIDINE IN NACL 20-0.9 MG/50ML-% IV SOLN
20.0000 mg | Freq: Two times a day (BID) | INTRAVENOUS | Status: DC
  Administered 2024-03-10 – 2024-03-12 (×5): 20 mg via INTRAVENOUS
  Filled 2024-03-10 (×5): qty 50

## 2024-03-10 MED ORDER — OXYCODONE HCL 5 MG PO TABS
10.0000 mg | ORAL_TABLET | ORAL | Status: DC | PRN
  Filled 2024-03-10: qty 2

## 2024-03-10 MED ORDER — SODIUM CHLORIDE 0.9 % IV SOLN
2.0000 g | Freq: Once | INTRAVENOUS | Status: AC
  Administered 2024-03-10: 2 g via INTRAVENOUS
  Filled 2024-03-10: qty 12.5

## 2024-03-10 MED ORDER — SODIUM CHLORIDE 0.9 % IV SOLN
1.0000 g | Freq: Three times a day (TID) | INTRAVENOUS | Status: DC
  Administered 2024-03-10 – 2024-03-11 (×3): 1 g via INTRAVENOUS
  Filled 2024-03-10 (×4): qty 20

## 2024-03-10 MED ORDER — FENTANYL CITRATE (PF) 100 MCG/2ML IJ SOLN
INTRAMUSCULAR | Status: AC
  Filled 2024-03-10: qty 2

## 2024-03-10 MED ORDER — NOREPINEPHRINE 16 MG/250ML-% IV SOLN
0.0000 ug/min | INTRAVENOUS | Status: DC
  Administered 2024-03-10: 40 ug/min via INTRAVENOUS
  Administered 2024-03-11: 15 ug/min via INTRAVENOUS
  Administered 2024-03-12: 11 ug/min via INTRAVENOUS
  Administered 2024-03-13: 22 ug/min via INTRAVENOUS
  Filled 2024-03-10 (×5): qty 250

## 2024-03-10 MED ORDER — METRONIDAZOLE 500 MG/100ML IV SOLN
500.0000 mg | Freq: Once | INTRAVENOUS | Status: AC
  Administered 2024-03-10: 500 mg via INTRAVENOUS
  Filled 2024-03-10: qty 100

## 2024-03-10 MED ORDER — STERILE WATER FOR INJECTION IV SOLN
INTRAVENOUS | Status: DC
  Filled 2024-03-10: qty 1000
  Filled 2024-03-10 (×7): qty 150
  Filled 2024-03-10: qty 1000

## 2024-03-10 MED ORDER — SUCCINYLCHOLINE CHLORIDE 200 MG/10ML IV SOSY
PREFILLED_SYRINGE | INTRAVENOUS | Status: DC | PRN
  Administered 2024-03-10: 160 mg via INTRAVENOUS

## 2024-03-10 MED ORDER — ONDANSETRON HCL 4 MG/2ML IJ SOLN
4.0000 mg | Freq: Three times a day (TID) | INTRAMUSCULAR | Status: DC | PRN
  Administered 2024-03-10: 4 mg via INTRAVENOUS
  Filled 2024-03-10 (×2): qty 2

## 2024-03-10 MED ORDER — EPINEPHRINE HCL 5 MG/250ML IV SOLN IN NS
INTRAVENOUS | Status: AC
  Filled 2024-03-10: qty 250

## 2024-03-10 MED ORDER — LOPERAMIDE HCL 1 MG/7.5ML PO SUSP: 2.0000 mg | ORAL | Status: DC | PRN

## 2024-03-10 MED ORDER — GUAIFENESIN 200 MG PO TABS
200.0000 mg | ORAL_TABLET | ORAL | Status: DC | PRN
  Administered 2024-03-10: 200 mg via ORAL
  Filled 2024-03-10 (×2): qty 1

## 2024-03-10 MED ORDER — ALBUMIN HUMAN 25 % IV SOLN
50.0000 g | Freq: Once | INTRAVENOUS | Status: AC
  Administered 2024-03-10: 50 g via INTRAVENOUS
  Filled 2024-03-10: qty 200

## 2024-03-10 MED ORDER — IPRATROPIUM-ALBUTEROL 0.5-2.5 (3) MG/3ML IN SOLN
3.0000 mL | RESPIRATORY_TRACT | Status: DC
  Administered 2024-03-10 – 2024-03-13 (×15): 3 mL via RESPIRATORY_TRACT
  Filled 2024-03-10 (×15): qty 3

## 2024-03-10 MED ORDER — METHYLPHENIDATE HCL 5 MG PO TABS: 5.0000 mg | ORAL_TABLET | Freq: Every day | ORAL | Status: DC

## 2024-03-10 MED ORDER — HYDROCORTISONE SOD SUC (PF) 100 MG IJ SOLR: 100.0000 mg | Freq: Once | INTRAMUSCULAR | Status: DC

## 2024-03-10 MED ORDER — SODIUM BICARBONATE 8.4 % IV SOLN
50.0000 meq | Freq: Once | INTRAVENOUS | Status: AC
  Administered 2024-03-10: 50 meq via INTRAVENOUS

## 2024-03-10 MED ORDER — PROPOFOL 1000 MG/100ML IV EMUL
INTRAVENOUS | Status: AC
  Filled 2024-03-10: qty 100

## 2024-03-10 MED ORDER — ROCURONIUM BROMIDE 10 MG/ML (PF) SYRINGE
PREFILLED_SYRINGE | INTRAVENOUS | Status: AC
  Filled 2024-03-10: qty 10

## 2024-03-10 MED ORDER — SODIUM CHLORIDE 3 % IN NEBU
4.0000 mL | INHALATION_SOLUTION | Freq: Three times a day (TID) | RESPIRATORY_TRACT | Status: DC
  Administered 2024-03-10 – 2024-03-12 (×6): 4 mL via RESPIRATORY_TRACT
  Filled 2024-03-10 (×8): qty 4

## 2024-03-10 MED ORDER — VANCOMYCIN HCL 1500 MG/300ML IV SOLN
1500.0000 mg | Freq: Two times a day (BID) | INTRAVENOUS | Status: DC
  Administered 2024-03-10 – 2024-03-11 (×2): 1500 mg via INTRAVENOUS
  Filled 2024-03-10 (×3): qty 300

## 2024-03-10 MED ORDER — VANCOMYCIN HCL IN DEXTROSE 1-5 GM/200ML-% IV SOLN
1000.0000 mg | Freq: Once | INTRAVENOUS | Status: AC
  Administered 2024-03-10: 1000 mg via INTRAVENOUS
  Filled 2024-03-10: qty 200

## 2024-03-10 MED ORDER — BOOST / RESOURCE BREEZE PO LIQD CUSTOM
1.0000 | Freq: Three times a day (TID) | ORAL | Status: DC
  Administered 2024-03-10 (×2): 1 via ORAL

## 2024-03-10 MED ORDER — FENTANYL BOLUS VIA INFUSION
25.0000 ug | INTRAVENOUS | Status: DC | PRN
  Administered 2024-03-10 (×2): 100 ug via INTRAVENOUS
  Administered 2024-03-11: 25 ug via INTRAVENOUS
  Administered 2024-03-11 – 2024-03-12 (×3): 50 ug via INTRAVENOUS
  Administered 2024-03-12: 75 ug via INTRAVENOUS
  Administered 2024-03-12 (×2): 100 ug via INTRAVENOUS
  Administered 2024-03-12 – 2024-03-13 (×4): 50 ug via INTRAVENOUS

## 2024-03-10 MED ORDER — HEPARIN SODIUM (PORCINE) 5000 UNIT/ML IJ SOLN
5000.0000 [IU] | Freq: Three times a day (TID) | INTRAMUSCULAR | Status: DC
  Administered 2024-03-10 – 2024-03-11 (×4): 5000 [IU] via SUBCUTANEOUS
  Filled 2024-03-10 (×5): qty 1

## 2024-03-10 MED ORDER — ONDANSETRON HCL 4 MG/2ML IJ SOLN
4.0000 mg | Freq: Once | INTRAMUSCULAR | Status: AC
  Administered 2024-03-10: 4 mg via INTRAVENOUS
  Filled 2024-03-10: qty 2

## 2024-03-10 MED ORDER — VASOPRESSIN 20 UNITS/100 ML INFUSION FOR SHOCK
0.0000 [IU]/min | INTRAVENOUS | Status: DC
  Administered 2024-03-10: 0.04 [IU]/min via INTRAVENOUS
  Administered 2024-03-10: 0.03 [IU]/min via INTRAVENOUS
  Administered 2024-03-11 – 2024-03-13 (×7): 0.04 [IU]/min via INTRAVENOUS
  Filled 2024-03-10 (×9): qty 100

## 2024-03-10 MED ORDER — LORATADINE 10 MG PO TABS
10.0000 mg | ORAL_TABLET | Freq: Every day | ORAL | Status: DC
  Filled 2024-03-10: qty 1

## 2024-03-10 MED ORDER — LORATADINE 10 MG PO TABS: 10.0000 mg | ORAL_TABLET | Freq: Every day | ORAL | Status: DC

## 2024-03-10 MED ORDER — SUCCINYLCHOLINE CHLORIDE 200 MG/10ML IV SOSY
PREFILLED_SYRINGE | INTRAVENOUS | Status: AC
  Filled 2024-03-10: qty 10

## 2024-03-10 MED ORDER — IOHEXOL 350 MG/ML SOLN
100.0000 mL | Freq: Once | INTRAVENOUS | Status: AC | PRN
  Administered 2024-03-10: 100 mL via INTRAVENOUS

## 2024-03-10 MED ORDER — GUAIFENESIN 200 MG PO TABS: 200.0000 mg | ORAL_TABLET | ORAL | Status: DC | PRN

## 2024-03-10 MED ORDER — PIPERACILLIN-TAZOBACTAM 3.375 G IVPB
3.3750 g | Freq: Three times a day (TID) | INTRAVENOUS | Status: DC
  Administered 2024-03-10: 3.375 g via INTRAVENOUS
  Filled 2024-03-10: qty 50

## 2024-03-10 MED ORDER — FENTANYL 2500MCG IN NS 250ML (10MCG/ML) PREMIX INFUSION
0.0000 ug/h | INTRAVENOUS | Status: DC
  Administered 2024-03-10: 50 ug/h via INTRAVENOUS
  Administered 2024-03-11: 100 ug/h via INTRAVENOUS
  Administered 2024-03-12: 125 ug/h via INTRAVENOUS
  Filled 2024-03-10 (×3): qty 250

## 2024-03-10 MED ORDER — ACETAMINOPHEN 325 MG PO TABS
650.0000 mg | ORAL_TABLET | ORAL | Status: DC | PRN
  Administered 2024-03-11 (×2): 650 mg
  Filled 2024-03-10 (×2): qty 2

## 2024-03-10 MED ORDER — PROPOFOL 1000 MG/100ML IV EMUL
0.0000 ug/kg/min | INTRAVENOUS | Status: DC
  Administered 2024-03-10: 40 ug/kg/min via INTRAVENOUS
  Administered 2024-03-10: 20 ug/kg/min via INTRAVENOUS
  Administered 2024-03-11 (×2): 40 ug/kg/min via INTRAVENOUS
  Administered 2024-03-11: 20 ug/kg/min via INTRAVENOUS
  Administered 2024-03-11: 30 ug/kg/min via INTRAVENOUS
  Administered 2024-03-12 (×2): 20 ug/kg/min via INTRAVENOUS
  Administered 2024-03-12: 25 ug/kg/min via INTRAVENOUS
  Administered 2024-03-12: 20 ug/kg/min via INTRAVENOUS
  Filled 2024-03-10 (×11): qty 100

## 2024-03-10 MED ORDER — HYDROCORTISONE SOD SUC (PF) 100 MG IJ SOLR
100.0000 mg | Freq: Once | INTRAMUSCULAR | Status: AC
  Administered 2024-03-10: 100 mg via INTRAVENOUS
  Filled 2024-03-10: qty 2

## 2024-03-10 MED ORDER — CHLORHEXIDINE GLUCONATE CLOTH 2 % EX PADS
6.0000 | MEDICATED_PAD | Freq: Every day | CUTANEOUS | Status: DC
  Administered 2024-03-10 – 2024-03-12 (×3): 6 via TOPICAL

## 2024-03-10 MED ORDER — SODIUM CHLORIDE 0.9 % IV SOLN: INTRAVENOUS | Status: AC | PRN

## 2024-03-10 MED ORDER — LACTATED RINGERS IV BOLUS (SEPSIS)
1500.0000 mL | Freq: Once | INTRAVENOUS | Status: AC
  Administered 2024-03-10: 1500 mL via INTRAVENOUS

## 2024-03-10 MED ORDER — PROPOFOL 10 MG/ML IV BOLUS
INTRAVENOUS | Status: DC | PRN
  Administered 2024-03-10: 70 mg via INTRAVENOUS

## 2024-03-10 MED ORDER — LACTATED RINGERS IV SOLN: INTRAVENOUS | Status: DC

## 2024-03-10 MED ORDER — ACETAMINOPHEN 325 MG PO TABS
650.0000 mg | ORAL_TABLET | ORAL | Status: DC | PRN
  Administered 2024-03-10: 650 mg via ORAL
  Filled 2024-03-10: qty 2

## 2024-03-10 MED ORDER — PROPOFOL 500 MG/50ML IV EMUL
INTRAVENOUS | Status: AC
  Filled 2024-03-10: qty 50

## 2024-03-10 MED ORDER — EPINEPHRINE HCL 5 MG/250ML IV SOLN IN NS
0.5000 ug/min | INTRAVENOUS | Status: DC
  Administered 2024-03-10: 0.5 ug/min via INTRAVENOUS

## 2024-03-10 MED ORDER — FENTANYL CITRATE (PF) 50 MCG/ML IJ SOSY: 25.0000 ug | PREFILLED_SYRINGE | Freq: Once | INTRAMUSCULAR | Status: DC

## 2024-03-10 MED ORDER — FENTANYL CITRATE (PF) 50 MCG/ML IJ SOSY
PREFILLED_SYRINGE | INTRAMUSCULAR | Status: AC
  Filled 2024-03-10: qty 2

## 2024-03-10 NOTE — Anesthesia Procedure Notes (Signed)
 Procedure Name: Intubation Date/Time: 03/10/2024 7:24 PM  Performed by: Leopoldo Bruckner, MDPre-anesthesia Checklist: Patient identified, Emergency Drugs available, Suction available and Patient being monitored Patient Re-evaluated:Patient Re-evaluated prior to induction Oxygen Delivery Method: Ambu bag Preoxygenation: Pre-oxygenation with 100% oxygen Induction Type: IV induction Ventilation: Mask ventilation without difficulty Laryngoscope Size: Glidescope and 3 Grade View: Grade I Tube type: Oral Tube size: 7.5 mm Number of attempts: 1 Airway Equipment and Method: Stylet and Oral airway Placement Confirmation: ETT inserted through vocal cords under direct vision, breath sounds checked- equal and bilateral and CO2 detector Secured at: 23 cm Tube secured with: Tape Dental Injury: Teeth and Oropharynx as per pre-operative assessment

## 2024-03-10 NOTE — Procedures (Signed)
 Arterial Catheter Insertion Procedure Note  Edwin Cook  [id number]  October 06, 1995  Date:03/10/2024  Time:7:36 PM    Provider Performing: Winfred Marny LITTIE Adrien Winfred    Procedure: Insertion of Arterial Line (63379) with US  guidance (23062)   Indication(s) Blood pressure monitoring and/or need for frequent ABGs  Consent Unable to obtain consent due to emergent nature of procedure.  Anesthesia Lidocaine  1%   Time Out Verified patient identification, verified procedure, site/side was marked, verified correct patient position, special equipment/implants available, medications/allergies/relevant history reviewed, required imaging and test results available.   Sterile Technique Maximal sterile technique including full sterile barrier drape, hand hygiene, sterile gown, sterile gloves, mask, hair covering, sterile ultrasound probe cover (if used).   Procedure Description Area of catheter insertion was cleaned with chlorhexidine  and draped in sterile fashion. With real-time ultrasound guidance an arterial catheter was placed into the right femoral artery.  Appropriate arterial tracings confirmed on monitor.     Complications/Tolerance None; patient tolerated the procedure well.   EBL Minimal   Specimen(s) None  Marny Adrien, MD

## 2024-03-10 NOTE — Progress Notes (Addendum)
 eLink Physician-Brief Progress Note Patient Name: Edwin Cook DOB: 06-06-1995 MRN: [id number]   Date of Service  03/10/2024  HPI/Events of Note  Sicker patient per eLink hand off.  Camera: Discussed with RN. Going for CT chest, CxR.  Sinus tachy 115, 140/75. Sats 100% On bicarb drip. Epi/levophed .   Neutropenic septic shock.  On chemotherapy for metastatic lung Ewing sarcoma on VIT Vincristine/ironotecan/temozolomide  Septic shock with unknown source Lactic acidosis > 9 HAGMA AKI   7.18/37/72/13 Cr 1.18 CT abd: no acute findings CTA: No PE, mulitlobar pneumonia Moderate pericardial effusion.   eICU Interventions  Follow CxR, scan. NG placement review      Intervention Category Intermediate Interventions: Other:  [name] 03/10/2024, 7:42 PM  2034 ET and NG tube in place. Ok to use NG tube.   2125 ABG: improving  metabolic acidsois with pH 7.27.   S/p 5.5 lit, on bicarb drip. On abx. -continue current plan of care, wean pressors as tolerated.  2222 1.) Patient is intubated- asking for CBG every 4 hours  2.) Diet order change to NPO - ordered  0208 RpH:  just fyi, microlab reports growing pseudomonas in 1/4>>pt is already on merrem  - continue care  0211 Critical Value: Lactic Acid 7.7  Patient has been high. was >9 on day shift, had come down to 5.9, but now is back up. Already on Bicarb at 150 ml/hr.   Last ABG  showing acidosis worsening but compensated by respiratory and bicarb drip.  - stat 50 meq of bicarb once, trend LA in AM. Full code  [phone] RN reports that she is having trouble managing patient's Temp. Currently HR is 140's and Temp 38.5. She has already given tylenol  and has ice packs on patient.  Camera re evaluation done.Pancytopenia.  - already getting hydrocortisone . Could be sarcoma related fever.  - since not seeing any GI bleeding, would go for low dose naproxen  trial via NG tube once.   06:14  Lactic Acid  >9  Saline 500 ml bolus once.

## 2024-03-10 NOTE — Progress Notes (Signed)
" °  Echocardiogram 2D Echocardiogram has been performed.  Devora Ellouise SAUNDERS 03/10/2024, 2:54 PM "

## 2024-03-10 NOTE — ED Triage Notes (Signed)
 Pt bib [facility] from home with complaints of shob and fever. Pt has a hx of ewings sarcoma with his last tx being Friday. Has had multiple episodes of diarrhea recently but started feeling worse this morning.  Pt found to have an initial bp of 70/40 which then dropped to 50/20 with EMS. Was started on epi at 4mcg/min and received 1.5 L LR en route. Pt initial oxygen saturation on room air was 85%, he was then placed on 4LNC. Baseline is not on oxygen.  Pt also took 650mg  tylenol  prior to EMS arrival.  Arrives to ED alert and oriented. Resp EU.

## 2024-03-10 NOTE — ED Provider Notes (Signed)
 " [place] EM[facility]MENT AT [facility] Provider Note   CSN: [id number] Arrival date & time: 03/10/24  9647     Patient presents with: No chief complaint on file.   Edwin Cook is a 28 y.o. male.   The history is provided by the patient and the EMS personnel.   He has history of Ewing sarcoma not in remission and is brought in because of fever and hypotension.  He states that he started having fever tonight up to 103 with associated chills.  There has been minimal cough, no sore throat.  He denies nausea, vomiting, diarrhea.  Denies any urinary difficulty.  EMS noted hypotension with blood pressure down into the 50s.  He has received about 2 L of IV fluid and has been started on an epinephrine  infusion.  They also noted some oxygen desaturation which has improved with supplemental nasal oxygen.    Prior to Admission medications  Medication Sig Start Date End Date Taking? Authorizing Provider  amoxicillin  (AMOXIL ) 500 MG capsule Take 1 capsule (500 mg total) by mouth 3 (three) times daily. 08/23/21   Lynwood Lenis, PA-C  dextromethorphan-guaiFENesin  (MUCINEX  DM) 30-600 MG 12hr tablet Take 1 tablet by mouth 2 (two) times daily. 08/23/21   Lynwood Lenis, PA-C  fluticasone  (FLONASE ) 50 MCG/ACT nasal spray Place 2 sprays into both nostrils daily. 08/23/21   Lynwood Lenis, PA-C    Allergies: Morphine and codeine    Review of Systems  All other systems reviewed and are negative.   Updated Vital Signs BP (!) 78/50   Pulse (!) 118   Temp (!) 101 F (38.3 C) (Oral)   Resp (!) 34   SpO2 98%   Physical Exam Vitals and nursing note reviewed.   28 year old male, resting comfortably and in no acute distress. Vital signs are significant for elevated temperature and elevated heart rate and low blood pressure. Oxygen saturation is 98%, which is normal.  In spite of fever and hypotension, he has nontoxic in appearance. Head is normocephalic and atraumatic. PERRLA, EOMI.  Oropharynx is clear. Neck is nontender and supple without adenopathy. Lungs are clear without rales, wheezes, or rhonchi. Chest is nontender.  Mediport is present on the left. Heart has regular rate and rhythm without murmur. Abdomen is soft, flat, nontender. Extremities have no cyanosis or edema, full range of motion is present. Skin is warm and dry without rash. Neurologic: Awake and alert, moves all extremities equally.  (all labs ordered are listed, but only abnormal results are displayed) Labs Reviewed  RESP PANEL BY RT-PCR (RSV, FLU A&B, COVID)  [id number]  CULTURE, BLOOD (ROUTINE X 2)  CULTURE, BLOOD (ROUTINE X 2)  COMPREHENSIVE METABOLIC PANEL WITH GFR  CBC WITH DIFFERENTIAL/PLATELET  PROTIME-INR  URINALYSIS, W/ REFLEX TO CULTURE (INFECTION SUSPECTED)  CBG MONITORING, ED  I-STAT CG4 LACTIC ACID, ED    EKG: EKG Interpretation Date/Time:  Thursday March 10 2024 04:09:24 EST Ventricular Rate:  133 PR Interval:  122 QRS Duration:  92 QT Interval:  312 QTC Calculation: 465 R Axis:   71  Text Interpretation: Sinus tachycardia Ventricular premature complex Aberrant complex Left atrial enlargement Low voltage, precordial leads When compared with ECG of 08/12/2019, HEART RATE has increased LOW VOLTAGE is now present Confirmed by Raford Lenis ([id number]) on 03/10/2024 4:11:32 AM  Radiology: No results found.   Procedures   Medications Ordered in the ED  lactated ringers  infusion (has no administration in time range)  lactated ringers  bolus 1,500 mL (  has no administration in time range)  ceFEPIme  (MAXIPIME ) 2 g in sodium chloride  0.9 % 100 mL IVPB (has no administration in time range)  metroNIDAZOLE  (FLAGYL ) IVPB 500 mg (has no administration in time range)  vancomycin  (VANCOCIN ) IVPB 1000 mg/200 mL premix (has no administration in time range)  norepinephrine  (LEVOPHED ) 4mg  in (0.016 mg/mL) premix infusion (has no administration in time range)                                     Medical Decision Making Amount and/or Complexity of Data Reviewed Labs: ordered. Radiology: ordered.  Risk Prescription drug management. Decision regarding hospitalization.   Sepsis without obvious cause and patient who is being treated for Ewing sarcoma.  I have reviewed his electrocardiogram, and my interpretation is sinus tachycardia with low voltage.  Compared with prior ECG, heart rate has increased and voltage has decreased, but prior ECG is from 2021.  I have ordered additional IV fluids and switching him from epinephrine  to norepinephrine  infusion.  I have reviewed his past records, and note patient getting chemotherapy for Ewing sarcoma with most recent infusion being Irinoteca on 03/04/2024.  On 12/15, WBC was 4.6, I do not see any recent episodes of leukopenia.  I have ordered initial broad-spectrum antibiotics.  His Mediport is being accessed.  4:42 AM Blood pressure has come up but he is still tachycardic.  Initial lactic acid level was moderately elevated.  Other labs are pending.  He will clearly need ICU since he is still on a norepinephrine  infusion.  I have discussed case with Dr. Maree of critical pulmonary critical care service who agrees to admit the patient.  6:18 AM Blood pressure initially responded to fluids and norepinephrine , mean arterial pressure increased to 65.  However, blood pressure then started to go down again.  I have ordered additional IV fluids.  I have reviewed his laboratory tests, my interpretation is severe leukopenia with absolute neutrophil count less than 100, anemia with fall and hemoglobin of 0.8 g compared with 12/15, mild thrombocytopenia which is probably not clinically significant, elevated random glucose level, mild hyponatremia which is not felt to be clinically significant, hypoalbuminemia which is likely nutritional.  I have ordered a dose of hydrocortisone  in case he is relatively addisonian.  I have reached out to Dr. Maree of  critical care services to request he see the patient urgently.  Repeat lactic acid level has actually increased despite aggressive fluid management.  CRITICAL CARE Performed by: Alm Lias Total critical care time: 180 minutes Critical care time was exclusive of separately billable procedures and treating other patients. Critical care was necessary to treat or prevent imminent or life-threatening deterioration. Critical care was time spent personally by me on the following activities: development of treatment plan with patient and/or surrogate as well as nursing, discussions with consultants, evaluation of patient's response to treatment, examination of patient, obtaining history from patient or surrogate, ordering and performing treatments and interventions, ordering and review of laboratory studies, ordering and review of radiographic studies, pulse oximetry and re-evaluation of patient's condition.     Final diagnoses:  Septic shock (HCC)  Drug-induced leukopenia  Normochromic normocytic anemia  Thrombocytopenia  Hyponatremia  Elevated random blood glucose level    ED Discharge Orders     None          Lias Alm, MD 03/10/24 [phone]  "

## 2024-03-10 NOTE — H&P (Signed)
 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 His last chemo was a few days ago with Vincristine/ironotecan/temozolomide with CT chest with mild interval progression in lung mets, stable dominant pleural mets. Patient completed 10 days of prophylactic antibiotics post chemotherapy. His oncologist is at [facility], Dr. Grilley-olson [place]. He has been dealing with diarrheas in the last few months.   This time he came with diarrheas, one day of increasing shortness if breath, fever, chills and came to the ER for evaluation.   On arrival to the ER, he became hypotensive requiring up to 40 of levophed , lactic acidosis up to 8. He has received 5L of fluids and antibiotics. CXR showed L lung decrease aeration. He has port cath. During my encounter on the ER, patient is awake, able to give information, BP has improved and pressors were stopped on arrival to the ICU. He had 2 episodes of small diarrhea.  Pertinent  Medical History   Past Medical History:  Diagnosis Date   Cancer [facility])    Ewing's sarcoma     Significant Hospital Events: Including procedures, antibiotic start and stop dates in addition to other pertinent events   03/10/24 - on arrival off pressors, however he suddenly became hypotension, with levophed  to 40, vasopressin  was added it, epinephrine , stress dose steroids and broad spectrum antibiotics.  Femoral line was placed. Wrist Art line difficult to access.  Interim History / Subjective:  Patient was diaphoretic, tachypneic. He had 2 diarrheas.  Objective    Blood pressure (!) 87/74, pulse (!) 104, temperature 97.9 F (36.6 C), temperature source Oral, resp. rate (!) 45, height 5' 10 (1.778 m),  weight 99.4 kg, SpO2 95%.        Intake/Output Summary (Last 24 hours) at 03/10/2024 1551 Last data filed at 03/10/2024 1113 Gross per 24 hour  Intake 6174.49 ml  Output 700 ml  Net 5474.49 ml   Filed Weights   03/10/24 0530 03/10/24 0846  Weight: 95.3 kg 99.4 kg    Examination: Physical Exam Constitutional:      Appearance: He is ill-appearing, toxic-appearing and diaphoretic.  HENT:     Head: Normocephalic.     Mouth/Throat:     Mouth: Mucous membranes are moist.  Eyes:     Extraocular Movements: Extraocular movements intact.     Pupils: Pupils are equal, round, and reactive to light.  Cardiovascular:     Rate and Rhythm: Normal rate and regular rhythm.  Pulmonary:     Effort: Respiratory distress present.     Comments: Increased resp distress, lung sounds present, no wheezing, or ronchus Abdominal:     General: Bowel sounds are normal.     Palpations: Abdomen is soft.  Musculoskeletal:        General: Normal range of motion.  Skin:    Coloration: Skin is pale.  Neurological:     General: No focal deficit present.     Mental Status: He is alert and oriented to person, place, and time.      Resolved problem list   Assessment and Plan   Neutropenic fever On chemotherapy for metastatic lung Ewing sarcoma on VIT Vincristine/ironotecan/temozolomide  Septic shock with unknown source Lactic acidosis HAGMA AKI  Patient  with extensive history of metastatic lung Ewing sarcoma since 2021 with recent chemotherapy who came due to neutropenic fever. He has septic shock with vaso, NE, stress dose steroids.  Plan Femoral arterial line, port line Broad spectrum > zosyn +vanc > consideration for meropenem  if his shock continues to worsen. CT A/P and  chest with IV contrast given his lactic acidosis and unknown infectious source Echocardiogram  Shock - on vaso, NE 35, HC 100 mg BID. Place epinephrine  given high dose of NE. HAGMA: on bicarb drip given acidosis ID work up  : blood cx, stool cx, respiratory cx, procal elevated. Viral negative. He has already received 5 L of fluids today. Shock refractory to fluids. GI: denied abdominal pain, diarrheas present Pocus- no good windows  Best Practice (right click and Reselect all SmartList Selections daily)   Diet/type: NPO DVT prophylaxis prophylactic heparin   Pressure ulcer(s): N/A GI prophylaxis: N/A Lines: yes and it is still needed Foley:  N/A Code Status:  full code Last date of multidisciplinary goals of care discussion [today]  Labs   CBC: Recent Labs  Lab 03/10/24 0435 03/10/24 1015  WBC 0.4* 0.4*  NEUTROABS 0.1* 0.1*  HGB 8.6* 8.7*  HCT 26.9* 26.5*  MCV 98.2 95.3  PLT 110* 87*    Basic Metabolic Panel: Recent Labs  Lab 03/10/24 0425 03/10/24 1015  NA 133* 132*  K 3.9 3.7  CL 102 104  CO2 19* 15*  GLUCOSE 132* 132*  BUN <5* 7  CREATININE 1.09 1.07  CALCIUM  7.3* 7.2*  MG  --  1.2*  PHOS  --  2.1*   GFR: Estimated Creatinine Clearance: 121.5 mL/min (by C-G formula based on SCr of 1.07 mg/dL). Recent Labs  Lab 03/10/24 0435 03/10/24 0652 03/10/24 1014 03/10/24 1015  PROCALCITON  --   --   --  27.70  WBC 0.4*  --   --  0.4*  LATICACIDVEN 5.2* 8.3* 7.6*  --     Liver Function Tests: Recent Labs  Lab 03/10/24 0425 03/10/24 1015  AST 19 22  ALT 32 30  ALKPHOS 86 72  BILITOT 0.5 0.5  PROT 3.9* 4.0*  ALBUMIN  2.5* 2.5*   No results for input(s): LIPASE, AMYLASE in the last 168 hours. No results for input(s): AMMONIA in the last 168 hours.  ABG    Component Value Date/Time   PHART 7.22 (L) 03/10/2024 1541   PCO2ART 34 03/10/2024 1541   PO2ART 79 (L) 03/10/2024 1541   HCO3 13.9 (L) 03/10/2024 1541   ACIDBASEDEF 12.8 (H) 03/10/2024 1541   O2SAT 98.2 03/10/2024 1541     Coagulation Profile: Recent Labs  Lab 03/10/24 0425  INR 1.4*    Cardiac Enzymes: No results for input(s): CKTOTAL, CKMB, CKMBINDEX, TROPONINI in the last 168  hours.  HbA1C: No results found for: HGBA1C  CBG: Recent Labs  Lab 03/10/24 0405  GLUCAP 86    Review of Systems:   As above  Past Medical History:  He,  has a past medical history of Cancer (HCC).   Surgical History:   Past Surgical History:  Procedure Laterality Date   FEMUR TUMOR RESECTION     KNEE SURGERY     KNEE SURGERY     MEDIPORT INSERTION, SINGLE     MEDIPORT REMOVAL       Social History:   reports that he has been smoking e-cigarettes. He has never used smokeless tobacco. He reports current alcohol use. He reports that he does not use drugs.  Family History:  His family history includes Asthma in his father; Diabetes in his mother; Hypertension in his mother; Thyroid disease in his mother.   Allergies Allergies[1]   Home Medications  Prior to Admission medications  Medication Sig Start Date End Date Taking? Authorizing Provider  cefixime (SUPRAX) 400 MG CAPS capsule Take 1 capsule (400mg ) by mouth daily starting 2 days prior to the start of chemotherapy, and continue through Day 8 (10 days total) 12/24/23 06/21/24 Yes [provider]  cetirizine (ZYRTEC) 10 MG tablet Take 10 mg by mouth daily. 12/01/23 11/30/24 Yes [provider]  dexamethasone  (DECADRON ) 4 MG tablet Take 2 tablets (8mg ) by mouth in the morning for 2 days after chemotherapy, then as directed. 06/05/21  Yes [provider]  dextromethorphan-guaiFENesin  (MUCINEX  DM) 30-600 MG 12hr tablet Take 1 tablet by mouth 2 (two) times daily. Patient taking differently: Take 1 tablet by mouth 2 (two) times daily as needed for cough. 08/23/21  Yes Lynwood Lenis, PA-C  diphenoxylate -atropine  (LOMOTIL ) 2.5-0.025 MG tablet Take 1 tablet by mouth 4 (four) times daily as needed for diarrhea or loose stools. 02/29/24  Yes [provider]  fluticasone  (FLONASE ) 50 MCG/ACT nasal spray Place 2 sprays into both nostrils daily. 08/23/21  Yes Lynwood Lenis, PA-C  K Phos  Mono-Sod Phos Leona &  Mono (PHOSPHOROUS) 155-852-130 MG TABS Take 3 tablets by mouth 2 (two) times daily.   Yes [provider]  loperamide  (IMODIUM ) 2 MG capsule Take 1 capsule (2 mg total) by mouth 4 (four) times daily as needed for Diarrhea 12/28/23  Yes [provider]  LORazepam (ATIVAN) 0.5 MG tablet Take 0.5 mg by mouth at bedtime as needed 03/03/22  Yes [provider]  methylphenidate  (RITALIN ) 5 MG tablet Take 1 tablet (5 mg total) by mouth once daily 12/28/23  Yes [provider]  ondansetron  (ZOFRAN ) 8 MG tablet Take 1 tablet (8mg ) by mouth twice daily on Days 6 & 7, then take 1 tablet every 8 hours as needed for Nausea and vomiting 06/10/21  Yes [provider]  Oxycodone  HCl 10 MG TABS Take 10 mg by mouth every 4 (four) hours. 05/19/23  Yes [provider]  potassium chloride  SA (KLOR-CON  M) 20 MEQ tablet Take 2 tablets (40 mEq total) by mouth 2 (two) times a day. 12/29/23  Yes [provider]  prochlorperazine  (COMPAZINE ) 10 MG tablet Take 1 tablet (10 mg total) by mouth every 8 (eight) hours as needed. 06/20/21  Yes [provider]  sertraline  (ZOLOFT ) 100 MG tablet Take 100 mg by mouth daily. 12/06/21  Yes [provider]  temozolomide (TEMODAR) 100 MG capsule Take 200mg  (2 capsules) PO on Days 1-5 of a 21-day cycle. Administer 60 minutes prior to irinotecan. 12/24/23  Yes [provider]  amoxicillin  (AMOXIL ) 500 MG capsule Take 1 capsule (500 mg total) by mouth 3 (three) times daily. Patient not taking: Reported on 03/10/2024 08/23/21   Lynwood Lenis, PA-C  methocarbamol (ROBAXIN) 750 MG tablet Take 1 tablet (750 mg total) by mouth 3 (three) times daily as needed Patient not taking: Reported on 03/10/2024 10/20/23   [provider]       The patient is critically ill due to refractory septic shock and febrile neutropenia.  Critical care was necessary to treat or prevent imminent or life-threatening deterioration.  Critical care time was spent by me on the following activities: development of a treatment plan with the patient and/or surrogate as well as nursing, discussions with  consultants, evaluation of the patient's response to treatment, examination of the patient, obtaining a history from the patient or surrogate, ordering and performing treatments and interventions, ordering and review of laboratory studies, ordering and review of radiographic studies, review of telemetry data including pulse oximetry, re-evaluation of patient's condition and participation in multidisciplinary rounds.   I personally spent 120 minutes providing critical care not including any separately billable procedures.  Marny Patch, MD [facility] 03/10/2024 3:51 PM                [1]  Allergies Allergen Reactions   Morphine    Morphine And Codeine Rash   "

## 2024-03-10 NOTE — Progress Notes (Signed)
 Elink monitoring for the code sepsis protocol.

## 2024-03-10 NOTE — Progress Notes (Signed)
 2D echo attempted, patient getting line placed. Will try later

## 2024-03-10 NOTE — Progress Notes (Signed)
 Pharmacy Antibiotic Note  Edwin Cook is a 28 y.o. male admitted on 03/10/2024 with SOB, fever, and multiple episodes of diarrhea. Now with concerns for febrile neutropenia. PMH of ewings sarcoma s/p recent chemo on 12/19. One time doses of vanc, flagyl , and cefepime  given in the ED.  Pharmacy has been consulted for vancomycin  and zosyn  dosing.  - Scr 1.09 (BL ~1), LA 8.3, Tmax 101, WBC 0.4  Plan: Vancomycin  1500mg  IV q12h (eAUC 461.3, Scr 1.09, Vd 0.72) Zosyn  3.375mg  IV q8h (4hr infusion) Vanc levels as appropriate   Monitor renal function, clinical status, fever curve  Follow up cultures   Height: 5' 10 (177.8 cm) Weight: 99.4 kg (219 lb 2.2 oz) IBW/kg (Calculated) : 73  Temp (24hrs), Avg:99.4 F (37.4 C), Min:97.9 F (36.6 C), Max:101 F (38.3 C)  Recent Labs  Lab 03/10/24 0425 03/10/24 0435 03/10/24 0652  WBC  --  0.4*  --   CREATININE 1.09  --   --   LATICACIDVEN  --  5.2* 8.3*    Estimated Creatinine Clearance: 119.3 mL/min (by C-G formula based on SCr of 1.09 mg/dL).    Allergies[1]  Antimicrobials this admission: Cefepime /flagyl  12/25 x1  Vanc 12/25 >   Dose adjustments this admission: None  Microbiology results: 12/25 GI PCR: 12/25 sputum cx: 12/25 bcx: 12/25 MRSA PCR:  12/25 Resp PCR: neg   Thank you for allowing pharmacy to be a part of this patients care.  Lynelle Weiler 03/10/2024 9:20 AM     [1]  Allergies Allergen Reactions   Morphine    Morphine And Codeine Rash

## 2024-03-11 ENCOUNTER — Inpatient Hospital Stay (HOSPITAL_COMMUNITY)

## 2024-03-11 DIAGNOSIS — D6959 Other secondary thrombocytopenia: Secondary | ICD-10-CM

## 2024-03-11 DIAGNOSIS — A4152 Sepsis due to Pseudomonas: Principal | ICD-10-CM

## 2024-03-11 DIAGNOSIS — D701 Agranulocytosis secondary to cancer chemotherapy: Secondary | ICD-10-CM

## 2024-03-11 DIAGNOSIS — T451X5A Adverse effect of antineoplastic and immunosuppressive drugs, initial encounter: Secondary | ICD-10-CM

## 2024-03-11 DIAGNOSIS — C7802 Secondary malignant neoplasm of left lung: Secondary | ICD-10-CM

## 2024-03-11 DIAGNOSIS — E872 Acidosis, unspecified: Secondary | ICD-10-CM

## 2024-03-11 DIAGNOSIS — C4021 Malignant neoplasm of long bones of right lower limb: Secondary | ICD-10-CM

## 2024-03-11 LAB — HEPATIC FUNCTION PANEL
ALT: 528 U/L — ABNORMAL HIGH (ref 0–44)
AST: 694 U/L — ABNORMAL HIGH (ref 15–41)
Albumin: 3 g/dL — ABNORMAL LOW (ref 3.5–5.0)
Alkaline Phosphatase: 58 U/L (ref 38–126)
Bilirubin, Direct: 1.5 mg/dL — ABNORMAL HIGH (ref 0.0–0.2)
Indirect Bilirubin: 0.4 mg/dL (ref 0.3–0.9)
Total Bilirubin: 1.9 mg/dL — ABNORMAL HIGH (ref 0.0–1.2)
Total Protein: 4.3 g/dL — ABNORMAL LOW (ref 6.5–8.1)

## 2024-03-11 LAB — BLOOD GAS, ARTERIAL
Acid-base deficit: 7.1 mmol/L — ABNORMAL HIGH (ref 0.0–2.0)
Acid-base deficit: 9.5 mmol/L — ABNORMAL HIGH (ref 0.0–2.0)
Acid-base deficit: 13.1 mmol/L — ABNORMAL HIGH (ref 0.0–2.0)
Acid-base deficit: 16.7 mmol/L — ABNORMAL HIGH (ref 0.0–2.0)
Acid-base deficit: 17 mmol/L — ABNORMAL HIGH (ref 0.0–2.0)
Bicarbonate: 15.8 mmol/L — ABNORMAL LOW (ref 20.0–28.0)
Bicarbonate: 13.9 mmol/L — ABNORMAL LOW (ref 20.0–28.0)
Bicarbonate: 10.8 mmol/L — ABNORMAL LOW (ref 20.0–28.0)
Bicarbonate: 8.8 mmol/L — ABNORMAL LOW (ref 20.0–28.0)
Bicarbonate: 8.4 mmol/L — ABNORMAL LOW (ref 20.0–28.0)
Drawn by: 74501
Drawn by: 74501
Drawn by: 31394
Drawn by: 74501
FIO2: 80 %
FIO2: 60 %
FIO2: 40 %
FIO2: 40 %
FIO2: 30 %
MECHVT: 580 mL
MECHVT: 580 mL
MECHVT: 580 mL
MECHVT: 580 mL
O2 Saturation: 98.5 %
O2 Saturation: 100 %
O2 Saturation: 99.1 %
O2 Saturation: 100 %
O2 Saturation: 100 %
PEEP: 5 cmH2O
PEEP: 5 cmH2O
PEEP: 5 cmH2O
PEEP: 5 cmH2O
PEEP: 5 cmH2O
Patient temperature: 38.2
Patient temperature: 38.3
Patient temperature: 36.7
Patient temperature: 36
Patient temperature: 37.6
RATE: 28 {breaths}/min
RATE: 28 {breaths}/min
RATE: 28 {breaths}/min
RATE: 28 {breaths}/min
RATE: 28 {breaths}/min
pCO2 arterial: 26 mmHg — ABNORMAL LOW (ref 32–48)
pCO2 arterial: 25 mmHg — ABNORMAL LOW (ref 32–48)
pCO2 arterial: 21 mmHg — ABNORMAL LOW (ref 32–48)
pCO2 arterial: 20 mmHg — ABNORMAL LOW (ref 32–48)
pCO2 arterial: 21 mmHg — ABNORMAL LOW (ref 32–48)
pH, Arterial: 7.39 (ref 7.35–7.45)
pH, Arterial: 7.35 (ref 7.35–7.45)
pH, Arterial: 7.32 — ABNORMAL LOW (ref 7.35–7.45)
pH, Arterial: 7.24 — ABNORMAL LOW (ref 7.35–7.45)
pH, Arterial: 7.22 — ABNORMAL LOW (ref 7.35–7.45)
pO2, Arterial: 213 mmHg — ABNORMAL HIGH (ref 83–108)
pO2, Arterial: 224 mmHg — ABNORMAL HIGH (ref 83–108)
pO2, Arterial: 164 mmHg — ABNORMAL HIGH (ref 83–108)
pO2, Arterial: 171 mmHg — ABNORMAL HIGH (ref 83–108)
pO2, Arterial: 125 mmHg — ABNORMAL HIGH (ref 83–108)

## 2024-03-11 LAB — BASIC METABOLIC PANEL WITH GFR
Anion gap: 23 — ABNORMAL HIGH (ref 5–15)
Anion gap: 28 — ABNORMAL HIGH (ref 5–15)
Anion gap: 29 — ABNORMAL HIGH (ref 5–15)
BUN: 9 mg/dL (ref 6–20)
BUN: 9 mg/dL (ref 6–20)
BUN: 11 mg/dL (ref 6–20)
CO2: 13 mmol/L — ABNORMAL LOW (ref 22–32)
CO2: 10 mmol/L — ABNORMAL LOW (ref 22–32)
CO2: 9 mmol/L — ABNORMAL LOW (ref 22–32)
Calcium: 6.7 mg/dL — ABNORMAL LOW (ref 8.9–10.3)
Calcium: 6.5 mg/dL — ABNORMAL LOW (ref 8.9–10.3)
Calcium: 6.6 mg/dL — ABNORMAL LOW (ref 8.9–10.3)
Chloride: 98 mmol/L (ref 98–111)
Chloride: 99 mmol/L (ref 98–111)
Chloride: 99 mmol/L (ref 98–111)
Creatinine, Ser: 0.99 mg/dL (ref 0.61–1.24)
Creatinine, Ser: 0.99 mg/dL (ref 0.61–1.24)
Creatinine, Ser: 1.05 mg/dL (ref 0.61–1.24)
GFR, Estimated: >60 mL/min
GFR, Estimated: >60 mL/min
GFR, Estimated: >60 mL/min
Glucose, Bld: 195 mg/dL — ABNORMAL HIGH (ref 70–99)
Glucose, Bld: 188 mg/dL — ABNORMAL HIGH (ref 70–99)
Glucose, Bld: 91 mg/dL (ref 70–99)
Potassium: 2.9 mmol/L — ABNORMAL LOW (ref 3.5–5.1)
Potassium: 2.8 mmol/L — ABNORMAL LOW (ref 3.5–5.1)
Potassium: 2.6 mmol/L — CL (ref 3.5–5.1)
Sodium: 134 mmol/L — ABNORMAL LOW (ref 135–145)
Sodium: 137 mmol/L (ref 135–145)
Sodium: 137 mmol/L (ref 135–145)

## 2024-03-11 LAB — CBC WITH DIFFERENTIAL/PLATELET
HCT: 25.7 % — ABNORMAL LOW (ref 39.0–52.0)
Hemoglobin: 8.7 g/dL — ABNORMAL LOW (ref 13.0–17.0)
MCH: 31.5 pg (ref 26.0–34.0)
MCHC: 33.9 g/dL (ref 30.0–36.0)
MCV: 93.1 fL (ref 80.0–100.0)
Platelets: 50 K/uL — ABNORMAL LOW (ref 150–400)
RBC: 2.76 MIL/uL — ABNORMAL LOW (ref 4.22–5.81)
RDW: 15.3 % (ref 11.5–15.5)
WBC: 0.1 K/uL — CL (ref 4.0–10.5)
nRBC: 21.4 % — ABNORMAL HIGH (ref 0.0–0.2)

## 2024-03-11 LAB — EXPECTORATED SPUTUM ASSESSMENT W GRAM STAIN, RFLX TO RESP C

## 2024-03-11 LAB — BLOOD CULTURE ID PANEL (REFLEXED) - BCID2

## 2024-03-11 LAB — GASTROINTESTINAL PANEL BY PCR, STOOL (REPLACES STOOL CULTURE)

## 2024-03-11 LAB — URIC ACID: Uric Acid, Serum: 3.3 mg/dL — ABNORMAL LOW (ref 3.7–8.6)

## 2024-03-11 LAB — COOXEMETRY PANEL
Carboxyhemoglobin: 1.3 % (ref 0.5–1.5)
Methemoglobin: 1 % (ref 0.0–1.5)
O2 Saturation: 100 %
Total hemoglobin: 9.6 g/dL — ABNORMAL LOW (ref 12.0–16.0)

## 2024-03-11 LAB — GLUCOSE, CAPILLARY
Glucose-Capillary: 133 mg/dL — ABNORMAL HIGH (ref 70–99)
Glucose-Capillary: 150 mg/dL — ABNORMAL HIGH (ref 70–99)
Glucose-Capillary: 168 mg/dL — ABNORMAL HIGH (ref 70–99)
Glucose-Capillary: 175 mg/dL — ABNORMAL HIGH (ref 70–99)
Glucose-Capillary: 175 mg/dL — ABNORMAL HIGH (ref 70–99)
Glucose-Capillary: 88 mg/dL (ref 70–99)

## 2024-03-11 LAB — LACTATE DEHYDROGENASE: LDH: 738 U/L — ABNORMAL HIGH (ref 105–235)

## 2024-03-11 LAB — PHOSPHORUS: Phosphorus: 5.3 mg/dL — ABNORMAL HIGH (ref 2.5–4.6)

## 2024-03-11 LAB — TROPONIN T, HIGH SENSITIVITY
Troponin T High Sensitivity: 31 ng/L — ABNORMAL HIGH (ref 0–19)
Troponin T High Sensitivity: 33 ng/L — ABNORMAL HIGH (ref 0–19)

## 2024-03-11 LAB — LACTIC ACID, PLASMA
Lactic Acid, Venous: 7.7 mmol/L (ref 0.5–1.9)
Lactic Acid, Venous: >9 mmol/L (ref 0.5–1.9)
Lactic Acid, Venous: >9 mmol/L (ref 0.5–1.9)
Lactic Acid, Venous: >9 mmol/L (ref 0.5–1.9)

## 2024-03-11 LAB — TRIGLYCERIDES: Triglycerides: 150 mg/dL — ABNORMAL HIGH

## 2024-03-11 LAB — MAGNESIUM
Magnesium: 1.6 mg/dL — ABNORMAL LOW (ref 1.7–2.4)
Magnesium: 2.2 mg/dL (ref 1.7–2.4)

## 2024-03-11 MED ORDER — SODIUM CHLORIDE 0.9 % IV SOLN
2.0000 g | Freq: Three times a day (TID) | INTRAVENOUS | Status: DC
  Administered 2024-03-11 – 2024-03-13 (×6): 2 g via INTRAVENOUS
  Filled 2024-03-11 (×6): qty 12.5

## 2024-03-11 MED ORDER — SODIUM CHLORIDE 0.9 % IV BOLUS
500.0000 mL | Freq: Once | INTRAVENOUS | Status: AC
  Administered 2024-03-11: 500 mL via INTRAVENOUS

## 2024-03-11 MED ORDER — NAPROXEN 250 MG PO TABS
250.0000 mg | ORAL_TABLET | Freq: Once | ORAL | Status: AC
  Administered 2024-03-11: 250 mg via NASOGASTRIC
  Filled 2024-03-11: qty 1

## 2024-03-11 MED ORDER — MAGNESIUM SULFATE 2 GM/50ML IV SOLN
2.0000 g | Freq: Once | INTRAVENOUS | Status: AC
  Administered 2024-03-11: 2 g via INTRAVENOUS
  Filled 2024-03-11: qty 50

## 2024-03-11 MED ORDER — STERILE WATER FOR INJECTION IV SOLN
INTRAVENOUS | Status: DC
  Filled 2024-03-11 (×6): qty 150

## 2024-03-11 MED ORDER — SODIUM BICARBONATE 8.4 % IV SOLN
50.0000 meq | Freq: Once | INTRAVENOUS | Status: AC
  Administered 2024-03-11: 50 meq via INTRAVENOUS
  Filled 2024-03-11: qty 50

## 2024-03-11 MED ORDER — FILGRASTIM-AAFI 480 MCG/0.8ML IJ SOSY
480.0000 ug | PREFILLED_SYRINGE | Freq: Once | INTRAMUSCULAR | Status: AC
  Administered 2024-03-11: 480 ug via SUBCUTANEOUS
  Filled 2024-03-11: qty 0.8

## 2024-03-11 MED ORDER — PRISMASOL BGK 4/2.5 32-4-2.5 MEQ/L EC SOLN: Status: DC

## 2024-03-11 MED ORDER — THIAMINE MONONITRATE 100 MG PO TABS
100.0000 mg | ORAL_TABLET | Freq: Every day | ORAL | Status: DC
  Administered 2024-03-11 – 2024-03-12 (×2): 100 mg
  Filled 2024-03-11 (×2): qty 1

## 2024-03-11 MED ORDER — CALCIUM GLUCONATE-NACL 1-0.675 GM/50ML-% IV SOLN
1.0000 g | Freq: Once | INTRAVENOUS | Status: AC
  Administered 2024-03-11: 1000 mg via INTRAVENOUS
  Filled 2024-03-11: qty 50

## 2024-03-11 MED ORDER — POTASSIUM CHLORIDE 20 MEQ PO PACK
40.0000 meq | PACK | ORAL | Status: AC
  Administered 2024-03-11 (×2): 40 meq
  Filled 2024-03-11 (×2): qty 2

## 2024-03-11 MED ORDER — ORAL CARE MOUTH RINSE
15.0000 mL | OROMUCOSAL | Status: DC
  Administered 2024-03-11 – 2024-03-13 (×21): 15 mL via OROMUCOSAL

## 2024-03-11 MED ORDER — VITAL 1.5 CAL PO LIQD
1000.0000 mL | ORAL | Status: DC
  Administered 2024-03-11 – 2024-03-12 (×2): 1000 mL
  Filled 2024-03-11 (×4): qty 1000

## 2024-03-11 MED ORDER — HEPARIN SODIUM (PORCINE) 1000 UNIT/ML DIALYSIS
1000.0000 [IU] | INTRAMUSCULAR | Status: DC | PRN
  Administered 2024-03-12: 2000 [IU] via INTRAVENOUS_CENTRAL
  Filled 2024-03-11: qty 6

## 2024-03-11 MED ORDER — ORAL CARE MOUTH RINSE: 15.0000 mL | OROMUCOSAL | Status: DC | PRN

## 2024-03-11 MED ORDER — POTASSIUM CHLORIDE 10 MEQ/50ML IV SOLN
10.0000 meq | INTRAVENOUS | Status: AC
  Administered 2024-03-11 – 2024-03-12 (×6): 10 meq via INTRAVENOUS
  Filled 2024-03-11 (×6): qty 50

## 2024-03-11 NOTE — Progress Notes (Signed)
 Sputum sample obtained and sent to lab at this time.

## 2024-03-11 NOTE — Progress Notes (Addendum)
 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 His last chemo was a few days ago with Vincristine/ironotecan/temozolomide with CT chest with mild interval progression in lung mets, stable dominant pleural mets. Patient completed 10 days of prophylactic antibiotics post chemotherapy. His oncologist is at [facility], Dr. Grilley-olson [place]. He has been dealing with diarrheas in the last few months.   This time he came with diarrheas, one day of increasing shortness if breath, fever, chills and came to the ER for evaluation.   On arrival to the ER, he became hypotensive requiring up to 40 of levophed , lactic acidosis up to 8. He has received 5L of fluids and antibiotics. CXR showed L lung decrease aeration. He has port cath. During my encounter on the ER, patient is awake, able to give information, BP has improved and pressors were stopped on arrival to the ICU. He had 2 episodes of small diarrhea. His course was complicated with requiring pressors, severe lactic acidosis, intubated.   Pertinent  Medical History   Past Medical History:  Diagnosis Date   Cancer [facility])    Ewing's sarcoma     Significant Hospital Events: Including procedures, antibiotic start and stop dates in addition to other pertinent events   03/10/24 - on arrival off pressors, however he suddenly became hypotension, with levophed  to 40, vasopressin  was added it, epinephrine , stress dose steroids and broad spectrum antibiotics.  Femoral line was placed. Tachypnea increased, requiring intubation.  Interim History / Subjective:  Patient was diaphoretic, tachypneic. He had 2 diarrheas.  Objective    Blood pressure (!) 87/74, pulse (!)  103, temperature 99.5 F (37.5 C), resp. rate (!) 28, height 5' 10 (1.778 m), weight 104.1 kg, SpO2 100%.    Vent Mode: PRVC FiO2 (%):  [40 %-100 %] 40 % Set Rate:  [28 bmp] 28 bmp Vt Set:  [560 mL-580 mL] 580 mL PEEP:  [5 cmH20] 5 cmH20 Plateau Pressure:  [19 cmH20-25 cmH20] 22 cmH20   Intake/Output Summary (Last 24 hours) at 03/11/2024 0732 Last data filed at 03/11/2024 0518 Gross per 24 hour  Intake 9831.33 ml  Output 3800 ml  Net 6031.33 ml   Filed Weights   03/10/24 0530 03/10/24 0846 03/11/24 0500  Weight: 95.3 kg 99.4 kg 104.1 kg    Examination: Physical Exam Constitutional:      Appearance: He is ill-appearing and toxic-appearing.  HENT:     Head: Normocephalic.     Mouth/Throat:     Mouth: Mucous membranes are moist.  Eyes:     Extraocular Movements: Extraocular movements intact.     Pupils: Pupils are equal, round, and reactive to light.  Cardiovascular:     Rate and Rhythm: Regular rhythm. Tachycardia present.  Pulmonary:     Comments: On the ventilator comfortable. synchronized Abdominal:     General: Bowel sounds are normal.     Palpations: Abdomen is soft.  Musculoskeletal:        General: Normal range of motion.  Skin:    General: Skin is warm.     Coloration: Skin is pale.  Neurological:     Mental Status: He is alert.     Comments: On the ventilator. sedated  Resolved problem list   Assessment and Plan   Neuro Sedated on the ventilator.  Mentation appropriate prior intubation, and during SAT  Respiratory LUL, and LLL pneumonia LLL Ewing sarcoma with mets with recent chemotherapy -on MV PRVC, Lung protection ventilation -PAD protocol -SAT/SBT daily -Maybe consideration for bronch - however we have a source of the infection.  - He is on minimal vent support and following commands, however his lactate is >9, which can lead to severe tachypnea as a compensation and being reintubated again. I will continue monitoring  LA.  Cardiac Septic shock with severe lactic acidosis EF 40-45%. With global hypokinesis. RV is moderately reduced. Small pericardia effusion Pseudomonas on blood cx On NE+vaso+HC 100mg  BID Lines: port, femoral art line  ID Septic shock 2/2 pseudomonas bacteremia Neutropenic fever On chemotherapy for metastatic lung Ewing sarcoma on recent chemo VIT Vincristine/ironotecan/temozolomide  Pseudomonas bacteremia MRSA +ve Patient with extensive history of metastatic lung Ewing sarcoma since 2021 with recent chemotherapy who came due to neutropenic fever. He has septic shock with vasopressin , NE, stress dose steroids. On vanc+meropenem  for now  > switching to cefepime  only given micro data, and no history of resistance. On stress dose steroids Pending sensitivity blood cx Pending sputum cx, UA cx, stool cx Echo with no vegetation ID > no other recs - ok to keep the port for now. After sensitivity > transition to cefepime   Renal Lactic acidosis HAGMA AKI  Severe lactic acidosis likely related to tumor burden, septic shock, shock liver. CTA/P negative, no bowel perf, normal liver morphology, not on metformin. Maintaining pH on bicarb ggt. On bicarb ggt > maintaining the pH Great urine output On foley Strict I/O  Hem/onc Neutropenia 2/2 to chemo Thrombocytopenia 2/2 to sepsis LLL Ewing sarcoma with mets with recent chemotherapy Monitor blood count.  Start filgrastrim x1 dose> AB Neutrophils 100 > trials with limited data, no clear benefit in mortality. NCCN guideline suggest GCSF maybe considered in patients with febrile neutropenia, however ID guidelines no clear benefit.  Plan to call his primary oncologist at [facility] for update (Dr Juneko Grilley-olson > [phone] I called without response. I am wondering the lactic acidosis is related to tumor burden. No signs of tumor lysis syndrome but he has been on chemo recently. I will place oncology consult.  Gastro Shock  liver Diarrheas Stool culture Start trickle TF Nutrition consult LFTs elevated likely related to shock  Best Practice (right click and Reselect all SmartList Selections daily)   Diet/type: TF start today  DVT prophylaxis prophylactic heparin   Pressure ulcer(s): N/A GI prophylaxis: N/A Lines: yes and it is still needed Foley:  N/A Code Status:  full code Last date of multidisciplinary goals of care discussion [today]  Labs   CBC: Recent Labs  Lab 03/10/24 0435 03/10/24 1015 03/11/24 0457  WBC 0.4* 0.4* 0.1*  NEUTROABS 0.1* 0.1* PENDING  HGB 8.6* 8.7* 8.7*  HCT 26.9* 26.5* 25.7*  MCV 98.2 95.3 93.1  PLT 110* 87* 50*    Basic Metabolic Panel: Recent Labs  Lab 03/10/24 0425 03/10/24 1015 03/10/24 1555 03/11/24 0457  NA 133* 132* 134* 134*  K 3.9 3.7 3.8 2.9*  CL 102 104 102 98  CO2 19* 15* 13* 13*  GLUCOSE 132* 132* 151* 195*  BUN <5* 7 8 9   CREATININE 1.09 1.07 1.18 0.99  CALCIUM  7.3* 7.2* 7.6* 6.7*  MG  --  1.2*  --   --   PHOS  --  2.1*  --   --  GFR: Estimated Creatinine Clearance: 134.2 mL/min (by C-G formula based on SCr of 0.99 mg/dL). Recent Labs  Lab 03/10/24 0435 03/10/24 0652 03/10/24 1015 03/10/24 1555 03/10/24 2120 03/11/24 0129 03/11/24 0457  PROCALCITON  --   --  27.70  --   --   --   --   WBC 0.4*  --  0.4*  --   --   --  0.1*  LATICACIDVEN 5.2*   < >  --  >9.0* 5.9* 7.7* >9.0*   < > = values in this interval not displayed.    Liver Function Tests: Recent Labs  Lab 03/10/24 0425 03/10/24 1015  AST 19 22  ALT 32 30  ALKPHOS 86 72  BILITOT 0.5 0.5  PROT 3.9* 4.0*  ALBUMIN  2.5* 2.5*   No results for input(s): LIPASE, AMYLASE in the last 168 hours. No results for input(s): AMMONIA in the last 168 hours.  ABG    Component Value Date/Time   PHART 7.35 03/11/2024 0458   PCO2ART 25 (L) 03/11/2024 0458   PO2ART 224 (H) 03/11/2024 0458   HCO3 13.9 (L) 03/11/2024 0458   ACIDBASEDEF 9.5 (H) 03/11/2024 0458   O2SAT  100 03/11/2024 0458     Coagulation Profile: Recent Labs  Lab 03/10/24 0425  INR 1.4*    Cardiac Enzymes: No results for input(s): CKTOTAL, CKMB, CKMBINDEX, TROPONINI in the last 168 hours.  HbA1C: No results found for: HGBA1C  CBG: Recent Labs  Lab 03/10/24 0405 03/10/24 1959 03/10/24 2354 03/11/24 0404  GLUCAP 86 154* 175* 175*    Review of Systems:   As above  Past Medical History:  He,  has a past medical history of Cancer (HCC).   Surgical History:   Past Surgical History:  Procedure Laterality Date   FEMUR TUMOR RESECTION     KNEE SURGERY     KNEE SURGERY     MEDIPORT INSERTION, SINGLE     MEDIPORT REMOVAL       Social History:   reports that he has been smoking e-cigarettes. He has never used smokeless tobacco. He reports current alcohol use. He reports that he does not use drugs.   Family History:  His family history includes Asthma in his father; Diabetes in his mother; Hypertension in his mother; Thyroid disease in his mother.   Allergies Allergies[1]   Home Medications  Prior to Admission medications  Medication Sig Start Date End Date Taking? Authorizing Provider  cefixime (SUPRAX) 400 MG CAPS capsule Take 1 capsule (400mg ) by mouth daily starting 2 days prior to the start of chemotherapy, and continue through Day 8 (10 days total) 12/24/23 06/21/24 Yes [provider]  cetirizine (ZYRTEC) 10 MG tablet Take 10 mg by mouth daily. 12/01/23 11/30/24 Yes [provider]  dexamethasone  (DECADRON ) 4 MG tablet Take 2 tablets (8mg ) by mouth in the morning for 2 days after chemotherapy, then as directed. 06/05/21  Yes [provider]  dextromethorphan-guaiFENesin  (MUCINEX  DM) 30-600 MG 12hr tablet Take 1 tablet by mouth 2 (two) times daily. Patient taking differently: Take 1 tablet by mouth 2 (two) times daily as needed for cough. 08/23/21  Yes Lynwood Lenis, PA-C  diphenoxylate -atropine  (LOMOTIL ) 2.5-0.025 MG tablet Take 1  tablet by mouth 4 (four) times daily as needed for diarrhea or loose stools. 02/29/24  Yes [provider]  fluticasone  (FLONASE ) 50 MCG/ACT nasal spray Place 2 sprays into both nostrils daily. 08/23/21  Yes Lynwood Lenis, PA-C  K Phos  Mono-Sod Phos Leona & Mono (PHOSPHOROUS)  [phone] MG TABS Take 3 tablets by mouth 2 (two) times daily.   Yes [provider]  loperamide  (IMODIUM ) 2 MG capsule Take 1 capsule (2 mg total) by mouth 4 (four) times daily as needed for Diarrhea 12/28/23  Yes [provider]  LORazepam (ATIVAN) 0.5 MG tablet Take 0.5 mg by mouth at bedtime as needed 03/03/22  Yes [provider]  methylphenidate  (RITALIN ) 5 MG tablet Take 1 tablet (5 mg total) by mouth once daily 12/28/23  Yes [provider]  ondansetron  (ZOFRAN ) 8 MG tablet Take 1 tablet (8mg ) by mouth twice daily on Days 6 & 7, then take 1 tablet every 8 hours as needed for Nausea and vomiting 06/10/21  Yes [provider]  Oxycodone  HCl 10 MG TABS Take 10 mg by mouth every 4 (four) hours. 05/19/23  Yes [provider]  potassium chloride  SA (KLOR-CON  M) 20 MEQ tablet Take 2 tablets (40 mEq total) by mouth 2 (two) times a day. 12/29/23  Yes [provider]  prochlorperazine  (COMPAZINE ) 10 MG tablet Take 1 tablet (10 mg total) by mouth every 8 (eight) hours as needed. 06/20/21  Yes [provider]  sertraline  (ZOLOFT ) 100 MG tablet Take 100 mg by mouth daily. 12/06/21  Yes [provider]  temozolomide (TEMODAR) 100 MG capsule Take 200mg  (2 capsules) PO on Days 1-5 of a 21-day cycle. Administer 60 minutes prior to irinotecan. 12/24/23  Yes [provider]  amoxicillin  (AMOXIL ) 500 MG capsule Take 1 capsule (500 mg total) by mouth 3 (three) times daily. Patient not taking: Reported on 03/10/2024 08/23/21   Lynwood Lenis, PA-C  methocarbamol (ROBAXIN) 750 MG tablet Take 1 tablet (750 mg total) by mouth 3 (three) times daily as  needed Patient not taking: Reported on 03/10/2024 10/20/23   [provider]       The patient is critically ill due to refractory septic shock and febrile neutropenia.  Critical care was necessary to treat or prevent imminent or life-threatening deterioration. Critical care time was spent by me on the following activities: development of a treatment plan with the patient and/or surrogate as well as nursing, discussions with consultants, evaluation of the patient's response to treatment, examination of the patient, obtaining a history from the patient or surrogate, ordering and performing treatments and interventions, ordering and review of laboratory studies, ordering and review of radiographic studies, review of telemetry data including pulse oximetry, re-evaluation of patient's condition and participation in multidisciplinary rounds.   I personally spent 45 minutes providing critical care not including any separately billable procedures.  Marny Patch, MD [facility] 03/11/2024 7:32 AM                 [1]  Allergies Allergen Reactions   Morphine    Morphine And Codeine Rash   "

## 2024-03-11 NOTE — Progress Notes (Signed)
 Initial Nutrition Assessment  INTERVENTION:   Monitor magnesium , potassium, and phosphorus daily for at least 3 days, MD to replete as needed, as pt is at risk for refeeding syndrome.  -Add Thiamine  100 mg daily for 7 days   -Initiate Vital 1.5 @ 15 ml/hr, advance by 10 ml every 12 hours to goal rate of 65 ml/hr. -Provides at goal: 2340 kcals, 105g protein and 1191 ml H2O -Free water  per CCM  -Once at goal consider Banatrol BID to aid in diarrhea  NUTRITION DIAGNOSIS:   Increased nutrient needs related to cancer and cancer related treatments as evidenced by estimated needs.  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 His last chemo was a few days ago with Vincristine/ironotecan/temozolomide with CT chest with mild interval progression in lung mets, stable dominant pleural mets. Patient completed 10 days of prophylactic antibiotics post chemotherapy. His oncologist is at [facility], Dr. Grilley-olson [place]. He has been dealing with diarrheas in the last few months. Admitted with SOB, fever, chills.  Patient admitted and intubated 12/25 d/t respiratory distress. Pt with history of ewings sarcoma with ongoing treatment of chemotherapy at [facility]. Last treatment on 12/19.  Pt has been reporting diarrhea for months. Continues this admission. Recommend Banatrol once receiving goal rate tube feeds. CCM has consulted to start trickle tube feeds today and advance slowly.   Patient is currently intubated on ventilator support MV: 15.7 L/min Temp (24hrs), Avg:99.8 F (37.7 C), Min:96.6 F (35.9 C), Max:101.5 F  (38.6 C) MAP: 94  Propofol : 23.9 ml/hr->providing ~630 fat kcals daily  Admission weight: 210 lbs Current weight: 229 lbs Pt with edema currently.  Medications: KLOR-CON , Fentanyl , Levophed , vasopressin   Labs reviewed: CBGs: 168 Low potassium TG 150 Low Phos 12/25 Low Mg 12/25   NUTRITION - FOCUSED PHYSICAL EXAM:  Unable to complete, working remotely.  Diet Order:   Diet Order             Diet NPO time specified  Diet effective now                   EDUCATION NEEDS:   Not appropriate for education at this time  Skin:  Skin Assessment: Reviewed RN Assessment  Last BM:  12/25 -type 7  Height:   Ht Readings from Last 1 Encounters:  03/10/24 5' 10 (1.778 m)    Weight:   Wt Readings from Last 1 Encounters:  03/11/24 104.1 kg   BMI:  Body mass index is 32.93 kg/m.  Estimated Nutritional Needs:   Kcal:  7749-7549  Protein:  110-120g  Fluid:  2.2L/day   Morna Lee, MS, RD, LDN Inpatient Clinical Dietitian Contact via Secure chat

## 2024-03-11 NOTE — Progress Notes (Signed)
 PHARMACY - PHYSICIAN COMMUNICATION CRITICAL VALUE ALERT - BLOOD CULTURE IDENTIFICATION (BCID)  Edwin Cook is an 28 y.o. male who presented to [facility] on 03/10/2024 with a chief complaint of neutropenic septic shock  Assessment:  1/4 pseudomonas  Name of physician (or Provider) ContactedBETHA Hutching  Current antibiotics: merrem , vanc  Changes to prescribed antibiotics recommended:  Patient is on recommended antibiotics - No changes needed  Results for orders placed or performed during the hospital encounter of 03/10/24  Blood Culture ID Panel (Reflexed) (Collected: 03/10/2024  4:35 AM)  Result Value Ref Range   Enterococcus faecalis NOT DETECTED NOT DETECTED   Enterococcus Faecium NOT DETECTED NOT DETECTED   Listeria monocytogenes NOT DETECTED NOT DETECTED   Staphylococcus species NOT DETECTED NOT DETECTED   Staphylococcus aureus (BCID) NOT DETECTED NOT DETECTED   Staphylococcus epidermidis NOT DETECTED NOT DETECTED   Staphylococcus lugdunensis NOT DETECTED NOT DETECTED   Streptococcus species NOT DETECTED NOT DETECTED   Streptococcus agalactiae NOT DETECTED NOT DETECTED   Streptococcus pneumoniae NOT DETECTED NOT DETECTED   Streptococcus pyogenes NOT DETECTED NOT DETECTED   A.calcoaceticus-baumannii NOT DETECTED NOT DETECTED   Bacteroides fragilis NOT DETECTED NOT DETECTED   Enterobacterales NOT DETECTED NOT DETECTED   Enterobacter cloacae complex NOT DETECTED NOT DETECTED   Escherichia coli NOT DETECTED NOT DETECTED   Klebsiella aerogenes NOT DETECTED NOT DETECTED   Klebsiella oxytoca NOT DETECTED NOT DETECTED   Klebsiella pneumoniae NOT DETECTED NOT DETECTED   Proteus species NOT DETECTED NOT DETECTED   Salmonella species NOT DETECTED NOT DETECTED   Serratia marcescens NOT DETECTED NOT DETECTED   Haemophilus influenzae NOT DETECTED NOT DETECTED   Neisseria meningitidis NOT DETECTED NOT DETECTED   Pseudomonas aeruginosa DETECTED (A) NOT DETECTED   Stenotrophomonas  maltophilia NOT DETECTED NOT DETECTED   Candida albicans NOT DETECTED NOT DETECTED   Candida auris NOT DETECTED NOT DETECTED   Candida glabrata NOT DETECTED NOT DETECTED   Candida krusei NOT DETECTED NOT DETECTED   Candida parapsilosis NOT DETECTED NOT DETECTED   Candida tropicalis NOT DETECTED NOT DETECTED   Cryptococcus neoformans/gattii NOT DETECTED NOT DETECTED   CTX-M ESBL NOT DETECTED NOT DETECTED   Carbapenem resistance IMP NOT DETECTED NOT DETECTED   Carbapenem resistance KPC NOT DETECTED NOT DETECTED   Carbapenem resistance NDM NOT DETECTED NOT DETECTED   Carbapenem resistance VIM NOT DETECTED NOT DETECTED    [name] 03/11/2024  2:08 AM

## 2024-03-11 NOTE — Progress Notes (Addendum)
 Brief Nephrology note: Consulted by ICU-E Link for persistent lactic acidosis, hypokalemia. 28 y/o male with Ewing sarcoma on chemo with disease progression, lung mets admitted with septic shock, shock liver on pressors. # Lactic acidosis likely due to persistent septic shock, shock liver, malignancy. Noted higher pressors requirement throughout the day and not really responding with IV bicarb drip. We will start CRRT with sodium bicarbonate  as pre-filter and post-filter replacement fluid. 4K/2.5 ca dialysate bath. ICU team will place the HD line. Agree with the repletion of ca, KCL and close lab monitoring. Full consult note to follow.  Mozella Rexrode, CKA.

## 2024-03-11 NOTE — Progress Notes (Addendum)
 eLink Physician-Brief Progress Note Patient Name: Edwin Cook DOB: 12-Jun-1995 MRN: [id number]   Date of Service  03/11/2024  HPI/Events of Note  E link was asked to follow 8 pm ABG. Results noted. Ph still more than 7.2. I see LA has been elevated during day shift as well. Is on bicarb drip. Pressor needs much better in terms of Levophed  dosing. Vaso fixed dose. Vent settings are unchanged VT is set to 8 cc/kg IBW already. Issue is mainly of metabolic acidosis and is also on bicarb drip. K was low this AM and so was mag  eICU Interventions  BMP and mag now Let me know results Continue current care     Intervention Category Major Interventions: Respiratory failure - evaluation and management  Mira Balon G Caitlin Hillmer 03/11/2024, 8:50 PM  10-15 pm - BMP and mag have resulted. K is very low while on bicarb infusion. Bicarb is 8. Mag is ok. I ordered 60 meq IV Kcl. I don't think vent adjustments can be made anymore at this time for the acidosis. Patient is also very mottled. Is now on 15 mic of levophed  and vaso. Will reach out to nephrology for their input.   1030 pm - Dr Dolan called from renal team. Case discussed with him. Agrees we will need CRRT. Also replacing calcium  since that is low. Bedside team to place the HD cath. Plan to repeat a BMP 3 hours after CRRT has started.  1050 pm. I updated the patient's mother Edwin Cook who is at bedside. She agrees and consents to placement of HD cath and dialysis. Updated Dr Maree from CCM.    3:10 am - HD cath is in, CRRT is to be started now. Having many PVC and monitor was reading STEMI so RN did an EKG, Continues to have prolonged Qtc as before and lot of artefact and PVCs noted. Really needs electrolyte and acid base imbalance corrected. Will also send BMP mag and ionized cal now as well.   635 am - I removed heparin  order due to drop in platelets. No bleeding anywhere. Requested RN to reach out to nephrology for electrolyte management while on  CRRT, and bicarb infusions.

## 2024-03-11 NOTE — Progress Notes (Signed)
 [facility] ADULT ICU REPLACEMENT PROTOCOL   The patient does apply for the [facility] Adult ICU Electrolyte Replacment Protocol based on the criteria listed below:   1.Exclusion criteria: TCTS, ECMO, Dialysis, and Myasthenia Gravis patients 2. Is GFR >/= 30 ml/min? Yes.    Patient's GFR today is >60 3. Is SCr </= 2? Yes.   Patient's SCr is 0.99 mg/dL 4. Did SCr increase >/= 0.5 in 24 hours? No. 5.Pt's weight >40kg  Yes.   6. Abnormal electrolyte(s): Potassium  7. Electrolytes replaced per protocol 8.  Call MD STAT for K+ </= 2.5, Phos </= 1, or Mag </= 1 Physician:  Dr. Rober Medico A Samariya Rockhold 03/11/2024 6:32 AM

## 2024-03-12 ENCOUNTER — Inpatient Hospital Stay (HOSPITAL_COMMUNITY)

## 2024-03-12 DIAGNOSIS — A419 Sepsis, unspecified organism: Secondary | ICD-10-CM | POA: Diagnosis not present

## 2024-03-12 DIAGNOSIS — Z452 Encounter for adjustment and management of vascular access device: Secondary | ICD-10-CM | POA: Diagnosis not present

## 2024-03-12 DIAGNOSIS — N179 Acute kidney failure, unspecified: Secondary | ICD-10-CM | POA: Diagnosis not present

## 2024-03-12 DIAGNOSIS — E8729 Other acidosis: Secondary | ICD-10-CM | POA: Diagnosis not present

## 2024-03-12 DIAGNOSIS — J189 Pneumonia, unspecified organism: Secondary | ICD-10-CM | POA: Diagnosis not present

## 2024-03-12 DIAGNOSIS — E878 Other disorders of electrolyte and fluid balance, not elsewhere classified: Secondary | ICD-10-CM

## 2024-03-12 DIAGNOSIS — K7200 Acute and subacute hepatic failure without coma: Secondary | ICD-10-CM

## 2024-03-12 DIAGNOSIS — C4022 Malignant neoplasm of long bones of left lower limb: Secondary | ICD-10-CM | POA: Diagnosis not present

## 2024-03-12 DIAGNOSIS — R6521 Severe sepsis with septic shock: Secondary | ICD-10-CM | POA: Diagnosis not present

## 2024-03-12 DIAGNOSIS — R918 Other nonspecific abnormal finding of lung field: Secondary | ICD-10-CM | POA: Diagnosis not present

## 2024-03-12 DIAGNOSIS — Z4682 Encounter for fitting and adjustment of non-vascular catheter: Secondary | ICD-10-CM | POA: Diagnosis not present

## 2024-03-12 DIAGNOSIS — J90 Pleural effusion, not elsewhere classified: Secondary | ICD-10-CM | POA: Diagnosis not present

## 2024-03-12 LAB — BLOOD GAS, ARTERIAL
Acid-base deficit: 12.8 mmol/L — ABNORMAL HIGH (ref 0.0–2.0)
Acid-base deficit: 17.4 mmol/L — ABNORMAL HIGH (ref 0.0–2.0)
Acid-base deficit: 8.1 mmol/L — ABNORMAL HIGH (ref 0.0–2.0)
Acid-base deficit: 6.4 mmol/L — ABNORMAL HIGH (ref 0.0–2.0)
Acid-base deficit: 7 mmol/L — ABNORMAL HIGH (ref 0.0–2.0)
Bicarbonate: 13.9 mmol/L — ABNORMAL LOW (ref 20.0–28.0)
Bicarbonate: 8.4 mmol/L — ABNORMAL LOW (ref 20.0–28.0)
Bicarbonate: 15.1 mmol/L — ABNORMAL LOW (ref 20.0–28.0)
Bicarbonate: 16.4 mmol/L — ABNORMAL LOW (ref 20.0–28.0)
Bicarbonate: 16.5 mmol/L — ABNORMAL LOW (ref 20.0–28.0)
Delivery systems: 10
Drawn by: 331471
Drawn by: 74501
Drawn by: 25788
Drawn by: 25788
FIO2: 30 %
FIO2: 30 %
FIO2: 30 %
FIO2: 40 %
MECHVT: 580 mL
MECHVT: 580 mL
MECHVT: 580 mL
MECHVT: 580 mL
O2 Saturation: 98.2 %
O2 Saturation: 100 %
O2 Saturation: 99.6 %
O2 Saturation: 100 %
O2 Saturation: 98.8 %
PEEP: 5 cmH2O
PEEP: 5 cmH2O
PEEP: 5 cmH2O
Patient temperature: 37
Patient temperature: 37
Patient temperature: 37.1
Patient temperature: 36.9
Patient temperature: 36.8
RATE: 28 {breaths}/min
RATE: 28 {breaths}/min
RATE: 24 {breaths}/min
pCO2 arterial: 34 mmHg (ref 32–48)
pCO2 arterial: 20 mmHg — ABNORMAL LOW (ref 32–48)
pCO2 arterial: 25 mmHg — ABNORMAL LOW (ref 32–48)
pCO2 arterial: 23 mmHg — ABNORMAL LOW (ref 32–48)
pCO2 arterial: 26 mmHg — ABNORMAL LOW (ref 32–48)
pH, Arterial: 7.22 — ABNORMAL LOW (ref 7.35–7.45)
pH, Arterial: 7.23 — ABNORMAL LOW (ref 7.35–7.45)
pH, Arterial: 7.39 (ref 7.35–7.45)
pH, Arterial: 7.46 — ABNORMAL HIGH (ref 7.35–7.45)
pH, Arterial: 7.41 (ref 7.35–7.45)
pO2, Arterial: 79 mmHg — ABNORMAL LOW (ref 83–108)
pO2, Arterial: 105 mmHg (ref 83–108)
pO2, Arterial: 97 mmHg (ref 83–108)
pO2, Arterial: 106 mmHg (ref 83–108)
pO2, Arterial: 98 mmHg (ref 83–108)

## 2024-03-12 LAB — BASIC METABOLIC PANEL WITH GFR
Anion gap: 26 — ABNORMAL HIGH (ref 5–15)
Anion gap: 26 — ABNORMAL HIGH (ref 5–15)
BUN: 12 mg/dL (ref 6–20)
BUN: 11 mg/dL (ref 6–20)
CO2: 9 mmol/L — ABNORMAL LOW (ref 22–32)
CO2: 12 mmol/L — ABNORMAL LOW (ref 22–32)
Calcium: 6.1 mg/dL — CL (ref 8.9–10.3)
Calcium: 6 mg/dL — CL (ref 8.9–10.3)
Chloride: 102 mmol/L (ref 98–111)
Chloride: 98 mmol/L (ref 98–111)
Creatinine, Ser: 1.16 mg/dL (ref 0.61–1.24)
Creatinine, Ser: 1.18 mg/dL (ref 0.61–1.24)
GFR, Estimated: >60 mL/min
GFR, Estimated: >60 mL/min
Glucose, Bld: 147 mg/dL — ABNORMAL HIGH (ref 70–99)
Glucose, Bld: 139 mg/dL — ABNORMAL HIGH (ref 70–99)
Potassium: 3 mmol/L — ABNORMAL LOW (ref 3.5–5.1)
Potassium: 3 mmol/L — ABNORMAL LOW (ref 3.5–5.1)
Sodium: 137 mmol/L (ref 135–145)
Sodium: 136 mmol/L (ref 135–145)

## 2024-03-12 LAB — RENAL FUNCTION PANEL
Albumin: 2.4 g/dL — ABNORMAL LOW (ref 3.5–5.0)
Albumin: 2.4 g/dL — ABNORMAL LOW (ref 3.5–5.0)
Anion gap: 26 — ABNORMAL HIGH (ref 5–15)
Anion gap: 25 — ABNORMAL HIGH (ref 5–15)
BUN: 11 mg/dL (ref 6–20)
BUN: 11 mg/dL (ref 6–20)
CO2: 12 mmol/L — ABNORMAL LOW (ref 22–32)
CO2: 17 mmol/L — ABNORMAL LOW (ref 22–32)
Calcium: 6.1 mg/dL — CL (ref 8.9–10.3)
Calcium: 6 mg/dL — CL (ref 8.9–10.3)
Chloride: 98 mmol/L (ref 98–111)
Chloride: 90 mmol/L — ABNORMAL LOW (ref 98–111)
Creatinine, Ser: 1.17 mg/dL (ref 0.61–1.24)
Creatinine, Ser: 1.16 mg/dL (ref 0.61–1.24)
GFR, Estimated: >60 mL/min
GFR, Estimated: >60 mL/min
Glucose, Bld: 138 mg/dL — ABNORMAL HIGH (ref 70–99)
Glucose, Bld: 118 mg/dL — ABNORMAL HIGH (ref 70–99)
Phosphorus: 3.6 mg/dL (ref 2.5–4.6)
Phosphorus: 3.5 mg/dL (ref 2.5–4.6)
Potassium: 3 mmol/L — ABNORMAL LOW (ref 3.5–5.1)
Potassium: 3.8 mmol/L (ref 3.5–5.1)
Sodium: 136 mmol/L (ref 135–145)
Sodium: 132 mmol/L — ABNORMAL LOW (ref 135–145)

## 2024-03-12 LAB — CBC WITH DIFFERENTIAL/PLATELET
Abs Immature Granulocytes: 0.02 K/uL (ref 0.00–0.07)
Basophils Absolute: 0 K/uL (ref 0.0–0.1)
Basophils Relative: 0 %
Eosinophils Absolute: 0 K/uL (ref 0.0–0.5)
Eosinophils Relative: 0 %
HCT: 24.5 % — ABNORMAL LOW (ref 39.0–52.0)
Hemoglobin: 8.3 g/dL — ABNORMAL LOW (ref 13.0–17.0)
Immature Granulocytes: 7 %
Lymphocytes Relative: 4 %
Lymphs Abs: 0 K/uL — ABNORMAL LOW (ref 0.7–4.0)
MCH: 32 pg (ref 26.0–34.0)
MCHC: 33.9 g/dL (ref 30.0–36.0)
MCV: 94.6 fL (ref 80.0–100.0)
Monocytes Absolute: 0 K/uL — ABNORMAL LOW (ref 0.1–1.0)
Monocytes Relative: 7 %
Neutro Abs: 0.2 K/uL — CL (ref 1.7–7.7)
Neutrophils Relative %: 82 %
Platelets: 12 K/uL — CL (ref 150–400)
RBC: 2.59 MIL/uL — ABNORMAL LOW (ref 4.22–5.81)
RDW: 15.9 % — ABNORMAL HIGH (ref 11.5–15.5)
WBC: 0.3 K/uL — CL (ref 4.0–10.5)
nRBC: 50 % — ABNORMAL HIGH (ref 0.0–0.2)

## 2024-03-12 LAB — GLUCOSE, CAPILLARY
Glucose-Capillary: 101 mg/dL — ABNORMAL HIGH (ref 70–99)
Glucose-Capillary: 109 mg/dL — ABNORMAL HIGH (ref 70–99)
Glucose-Capillary: 113 mg/dL — ABNORMAL HIGH (ref 70–99)
Glucose-Capillary: 139 mg/dL — ABNORMAL HIGH (ref 70–99)
Glucose-Capillary: 146 mg/dL — ABNORMAL HIGH (ref 70–99)
Glucose-Capillary: 37 mg/dL — CL (ref 70–99)
Glucose-Capillary: 46 mg/dL — ABNORMAL LOW (ref 70–99)
Glucose-Capillary: 51 mg/dL — ABNORMAL LOW (ref 70–99)
Glucose-Capillary: 90 mg/dL (ref 70–99)
Glucose-Capillary: 94 mg/dL (ref 70–99)

## 2024-03-12 LAB — CBC
HCT: 23.7 % — ABNORMAL LOW (ref 39.0–52.0)
Hemoglobin: 8.1 g/dL — ABNORMAL LOW (ref 13.0–17.0)
MCH: 31.6 pg (ref 26.0–34.0)
MCHC: 34.2 g/dL (ref 30.0–36.0)
MCV: 92.6 fL (ref 80.0–100.0)
Platelets: 25 K/uL — CL (ref 150–400)
RBC: 2.56 MIL/uL — ABNORMAL LOW (ref 4.22–5.81)
RDW: 15.8 % — ABNORMAL HIGH (ref 11.5–15.5)
WBC: 0.6 K/uL — CL (ref 4.0–10.5)
nRBC: 26.6 % — ABNORMAL HIGH (ref 0.0–0.2)

## 2024-03-12 LAB — DIC (DISSEMINATED INTRAVASCULAR COAGULATION)PANEL
D-Dimer, Quant: 2.98 ug{FEU}/mL — ABNORMAL HIGH (ref 0.00–0.50)
Fibrinogen: 268 mg/dL (ref 210–475)
INR: >10 (ref 0.8–1.2)
Platelets: 12 K/uL — CL (ref 150–400)
Prothrombin Time: >90 s — ABNORMAL HIGH (ref 11.4–15.2)
aPTT: 70 s — ABNORMAL HIGH (ref 24–36)

## 2024-03-12 LAB — LACTIC ACID, PLASMA
Lactic Acid, Venous: >9 mmol/L (ref 0.5–1.9)
Lactic Acid, Venous: >9 mmol/L (ref 0.5–1.9)
Lactic Acid, Venous: >9 mmol/L (ref 0.5–1.9)
Lactic Acid, Venous: >9 mmol/L (ref 0.5–1.9)

## 2024-03-12 LAB — HEPATIC FUNCTION PANEL
ALT: 2750 U/L — ABNORMAL HIGH (ref 0–44)
AST: 3465 U/L — ABNORMAL HIGH (ref 15–41)
Albumin: 2.3 g/dL — ABNORMAL LOW (ref 3.5–5.0)
Alkaline Phosphatase: 114 U/L (ref 38–126)
Bilirubin, Direct: 2.2 mg/dL — ABNORMAL HIGH (ref 0.0–0.2)
Indirect Bilirubin: 0.6 mg/dL (ref 0.3–0.9)
Total Bilirubin: 2.8 mg/dL — ABNORMAL HIGH (ref 0.0–1.2)
Total Protein: 3.4 g/dL — ABNORMAL LOW (ref 6.5–8.1)

## 2024-03-12 LAB — TYPE AND SCREEN
ABO/RH(D): A POS
Antibody Screen: NEGATIVE

## 2024-03-12 LAB — LIPASE, BLOOD: Lipase: 11 U/L (ref 11–51)

## 2024-03-12 LAB — ABO/RH: ABO/RH(D): A POS

## 2024-03-12 LAB — CULTURE, RESPIRATORY W GRAM STAIN

## 2024-03-12 LAB — LEGIONELLA PNEUMOPHILA SEROGP 1 UR AG: L. pneumophila Serogp 1 Ur Ag: NEGATIVE

## 2024-03-12 LAB — MAGNESIUM
Magnesium: 2 mg/dL (ref 1.7–2.4)
Magnesium: 2 mg/dL (ref 1.7–2.4)

## 2024-03-12 LAB — PHOSPHORUS: Phosphorus: 3.6 mg/dL (ref 2.5–4.6)

## 2024-03-12 MED ORDER — DEXTROSE 50 % IV SOLN: 12.5000 g | INTRAVENOUS | Status: DC

## 2024-03-12 MED ORDER — SODIUM CHLORIDE 0.9 % IV BOLUS
1000.0000 mL | Freq: Once | INTRAVENOUS | Status: AC
  Administered 2024-03-12: 1000 mL via INTRAVENOUS

## 2024-03-12 MED ORDER — DEXTROSE 10 % IV SOLN: INTRAVENOUS | Status: DC

## 2024-03-12 MED ORDER — MUPIROCIN 2 % EX OINT
TOPICAL_OINTMENT | Freq: Two times a day (BID) | CUTANEOUS | Status: DC
  Administered 2024-03-12: 1 via NASAL
  Filled 2024-03-12: qty 22

## 2024-03-12 MED ORDER — LACTATED RINGERS IV BOLUS
500.0000 mL | Freq: Once | INTRAVENOUS | Status: AC
  Administered 2024-03-12: 500 mL via INTRAVENOUS

## 2024-03-12 MED ORDER — RENA-VITE PO TABS
1.0000 | ORAL_TABLET | Freq: Every day | ORAL | Status: DC
  Administered 2024-03-12: 1
  Filled 2024-03-12: qty 1

## 2024-03-12 MED ORDER — CALCIUM GLUCONATE-NACL 2-0.675 GM/100ML-% IV SOLN
2.0000 g | Freq: Once | INTRAVENOUS | Status: AC
  Administered 2024-03-12: 2000 mg via INTRAVENOUS
  Filled 2024-03-12: qty 100

## 2024-03-12 MED ORDER — DEXTROSE 50 % IV SOLN
INTRAVENOUS | Status: AC
  Filled 2024-03-12: qty 50

## 2024-03-12 MED ORDER — POTASSIUM CHLORIDE 10 MEQ/50ML IV SOLN
10.0000 meq | INTRAVENOUS | Status: AC
  Administered 2024-03-12 (×4): 10 meq via INTRAVENOUS
  Filled 2024-03-12 (×4): qty 50

## 2024-03-12 MED ORDER — NAPROXEN 250 MG PO TABS: 250.0000 mg | ORAL_TABLET | Freq: Two times a day (BID) | ORAL | Status: DC | PRN

## 2024-03-12 MED ORDER — AMIODARONE HCL IN DEXTROSE 360-4.14 MG/200ML-% IV SOLN
60.0000 mg/h | INTRAVENOUS | Status: AC
  Administered 2024-03-12 (×2): 60 mg/h via INTRAVENOUS
  Filled 2024-03-12 (×2): qty 200

## 2024-03-12 MED ORDER — SODIUM CHLORIDE 0.9 % IV SOLN
4.0000 g | Freq: Once | INTRAVENOUS | Status: AC
  Administered 2024-03-12: 4 g via INTRAVENOUS
  Filled 2024-03-12: qty 40

## 2024-03-12 MED ORDER — NAPROXEN 125 MG/5ML PO SUSP: 250.0000 mg | Freq: Two times a day (BID) | ORAL | Status: DC | PRN

## 2024-03-12 MED ORDER — AMIODARONE HCL IN DEXTROSE 360-4.14 MG/200ML-% IV SOLN
60.0000 mg/h | INTRAVENOUS | Status: DC
  Administered 2024-03-12 – 2024-03-13 (×2): 30 mg/h via INTRAVENOUS
  Administered 2024-03-13: 60 mg/h via INTRAVENOUS
  Filled 2024-03-12 (×2): qty 200

## 2024-03-12 MED ORDER — SODIUM CHLORIDE 0.9 % IV SOLN: INTRAVENOUS | Status: DC

## 2024-03-12 MED ORDER — DEXTROSE 50 % IV SOLN
25.0000 g | INTRAVENOUS | Status: AC
  Administered 2024-03-12: 25 g via INTRAVENOUS

## 2024-03-12 MED ORDER — SODIUM CHLORIDE 0.9% IV SOLUTION: Freq: Once | INTRAVENOUS | Status: AC

## 2024-03-12 MED ORDER — PROSOURCE TF20 ENFIT COMPATIBL EN LIQD
60.0000 mL | Freq: Every day | ENTERAL | Status: DC
  Administered 2024-03-12: 60 mL
  Filled 2024-03-12: qty 60

## 2024-03-12 MED ORDER — AMIODARONE LOAD VIA INFUSION
150.0000 mg | Freq: Once | INTRAVENOUS | Status: AC
  Administered 2024-03-12: 150 mg via INTRAVENOUS
  Filled 2024-03-12: qty 83.34

## 2024-03-12 MED ORDER — ACETAMINOPHEN 500 MG PO TABS
1000.0000 mg | ORAL_TABLET | Freq: Four times a day (QID) | ORAL | Status: DC | PRN
  Administered 2024-03-12: 1000 mg
  Filled 2024-03-12: qty 2

## 2024-03-12 NOTE — Plan of Care (Signed)
  Problem: Nutrition: Goal: Adequate nutrition will be maintained Outcome: Progressing   Problem: Coping: Goal: Level of anxiety will decrease Outcome: Progressing   Problem: Pain Managment: Goal: General experience of comfort will improve and/or be controlled Outcome: Progressing

## 2024-03-12 NOTE — Progress Notes (Signed)
 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 His last chemo was a few days ago with Vincristine/ironotecan/temozolomide with CT chest with mild interval progression in lung mets, stable dominant pleural mets. Patient completed 10 days of prophylactic antibiotics post chemotherapy. His oncologist is at [facility], Dr. Grilley-olson Cook. He has been dealing with diarrheas in the last few months.   This time he came with diarrheas, one day of increasing shortness if breath, fever, chills and came to the ER for evaluation.   On arrival to the ER, he became hypotensive requiring up to 40 of levophed , lactic acidosis up to 8. He has received 5L of fluids and antibiotics. CXR showed L lung decrease aeration. He has port cath. During my encounter on the ER, patient is awake, able to give information, BP has improved and pressors were stopped on arrival to the ICU. He had 2 episodes of small diarrhea. His course was complicated with requiring pressors, severe lactic acidosis, intubated.   Pertinent  Medical History   Past Medical History:  Diagnosis Date   Cancer [facility])    Ewing's sarcoma     Significant Hospital Events: Including procedures, antibiotic start and stop dates in addition to other pertinent events   03/10/24 - on arrival off pressors, however he suddenly became hypotension, with levophed  to 40, vasopressin  was added it, epinephrine , stress dose steroids and broad spectrum antibiotics.  Femoral line was placed. Tachypnea increased, requiring intubation. 03/12/24 started CRRT  Interim History / Subjective:   Patient remains intubated/sedated Mother and Aunt at bedside, updates  provided   Objective    Blood pressure (!) 87/74, pulse (!) 122, temperature 99 F (37.2 C), resp. rate (!) 28, height 5' 10 (1.778 m), weight 105.7 kg, SpO2 100%.    Vent Mode: PRVC FiO2 (%):  [30 %-40 %] 30 % Set Rate:  [28 bmp] 28 bmp Vt Set:  [580 mL] 580 mL PEEP:  [5 cmH20] 5 cmH20 Plateau Pressure:  [22 cmH20-25 cmH20] 25 cmH20   Intake/Output Summary (Last 24 hours) at 03/12/2024 0715 Last data filed at 03/12/2024 0700 Gross per 24 hour  Intake 8446.78 ml  Output 6284.3 ml  Net 2162.48 ml   Filed Weights   03/10/24 0846 03/11/24 0500 03/12/24 0500  Weight: 99.4 kg 104.1 kg 105.7 kg    Examination: Physical Exam Constitutional:      Appearance: He is ill-appearing, toxic-appearing and diaphoretic.  HENT:     Head: Normocephalic.     Mouth/Throat:     Mouth: Mucous membranes are moist.  Eyes:     Conjunctiva/sclera: Conjunctivae normal.     Pupils: Pupils are equal, round, and reactive to light.  Cardiovascular:     Rate and Rhythm: Regular rhythm. Tachycardia present.  Pulmonary:     Breath sounds: No wheezing or rhonchi.     Comments: On the ventilator comfortable. synchronized Abdominal:     General: Bowel sounds are normal.     Palpations: Abdomen is soft.  Musculoskeletal:        General: Normal range of motion.  Skin:    General: Skin is warm.     Capillary Refill: Capillary refill takes 2 to 3 seconds.  Coloration: Skin is pale.  Neurological:     Mental Status: He is alert.     Comments: On the ventilator. sedated      Resolved problem list   Assessment and Plan   Neuro Sedated on the ventilator.    Respiratory LUL, and LLL pneumonia LLL Ewing sarcoma with mets with recent chemotherapy -on MV PRVC, Lung protection ventilation -PAD protocol -No SAT/SBT today  Cardiac Septic shock with severe lactic acidosis Prolonged QTc EF 40-45%. With global hypokinesis. RV is moderately reduced. Small pericardial effusion Pseudomonas on  blood cx On NE+vaso+HC 100mg  BID Lines: port, femoral art line, left internal jugular HD cath Replace electrolytes as able, goal K 4 or greater, Mg 2 or greater  ID Septic shock 2/2 pseudomonas bacteremia Neutropenic fever On chemotherapy for metastatic lung Ewing sarcoma on recent chemo VIT Vincristine/ironotecan/temozolomide  Pseudomonas bacteremia MRSA +ve Patient with extensive history of metastatic lung Ewing sarcoma since 2021 with recent chemotherapy who came due to neutropenic fever. He has septic shock with vasopressin , NE, stress dose steroids. On cefepime  based on micro data, switched from vanc + meropenem  yesterday Continue stress dose steroids Pending sensitivity blood cx Pending sputum cx, UA cx, stool cx Echo with no vegetation  Renal Lactic acidosis HAGMA AKI  Electrolyte Abnormalities Severe lactic acidosis likely related to tumor burden, septic shock, shock liver. CTA/P negative, no bowel perf, normal liver morphology, not on metformin.  Started on CRRT 12/27 and on bicarb drip Urine output remains good Continue foley Strict I/O Replace electrolytes as needed  Hem/onc Pancytopenia 2/2 chemo and sepsis LLL Ewing sarcoma with mets with recent chemotherapy Monitor blood count.  Transfuse unit of platelets this AM Started filgrastrim x1 dose 12/26> AB Neutrophils 100 > trials with limited data, no clear benefit in mortality. NCCN guideline suggest GCSF maybe considered in patients with febrile neutropenia, however ID guidelines no clear benefit.  No signs of tumor lysis syndrome but he has been on chemo recently.  Will discuss filgastrim with heme/onc team 4T score 2, low probability of HIT Check DIC panel  Gastro Shock liver Diarrheas Stool culture Start trickle TF Nutrition consult LFTs elevated likely related to shock, trend  Best Practice (right click and Reselect all SmartList Selections daily)   Diet/type: TF  DVT prophylaxis prophylactic  heparin   Pressure ulcer(s): N/A GI prophylaxis: N/A Lines: yes and it is still needed Foley:  N/A Code Status:  full code Last date of multidisciplinary goals of care discussion [updated mother and aunt at bedside]  Labs   CBC: Recent Labs  Lab 03/10/24 0435 03/10/24 1015 03/11/24 0457 03/12/24 0527  WBC 0.4* 0.4* 0.1* 0.3*  NEUTROABS 0.1* 0.1*  --  0.2*  HGB 8.6* 8.7* 8.7* 8.3*  HCT 26.9* 26.5* 25.7* 24.5*  MCV 98.2 95.3 93.1 94.6  PLT 110* 87* 50* 12*    Basic Metabolic Panel: Recent Labs  Lab 03/10/24 1015 03/10/24 1555 03/11/24 0830 03/11/24 1433 03/11/24 2105 03/12/24 0320 03/12/24 0527 03/12/24 0528  NA 132*   < > 137  --  137 137 136 136  K 3.7   < > 2.8*  --  2.6* 3.0* 3.0* 3.0*  CL 104   < > 99  --  99 102 98 98  CO2 15*   < > 10*  --  9* 9* 12* 12*  GLUCOSE 132*   < > 188*  --  91 147* 139* 138*  BUN 7   < > 9  --  11  12 11 11   CREATININE 1.07   < > 0.99  --  1.05 1.16 1.18 1.17  CALCIUM  7.2*   < > 6.5*  --  6.6* 6.1* 6.0* 6.1*  MG 1.2*  --   --  1.6* 2.2 2.0 2.0  --   PHOS 2.1*  --   --  5.3*  --   --  3.6 3.6   < > = values in this interval not displayed.   GFR: Estimated Creatinine Clearance: 114.5 mL/min (by C-G formula based on SCr of 1.17 mg/dL). Recent Labs  Lab 03/10/24 0435 03/10/24 0652 03/10/24 1015 03/10/24 1555 03/11/24 0457 03/11/24 1109 03/11/24 1743 03/11/24 2317 03/12/24 0527  PROCALCITON  --   --  27.70  --   --   --   --   --   --   WBC 0.4*  --  0.4*  --  0.1*  --   --   --  0.3*  LATICACIDVEN 5.2*   < >  --    < > >9.0* >9.0* >9.0* >9.0* >9.0*   < > = values in this interval not displayed.    Liver Function Tests: Recent Labs  Lab 03/10/24 0425 03/10/24 1015 03/11/24 1346 03/12/24 0528  AST 19 22 694*  --   ALT 32 30 528*  --   ALKPHOS 86 72 58  --   BILITOT 0.5 0.5 1.9*  --   PROT 3.9* 4.0* 4.3*  --   ALBUMIN  2.5* 2.5* 3.0* 2.4*   No results for input(s): LIPASE, AMYLASE in the last 168 hours. No  results for input(s): AMMONIA in the last 168 hours.  ABG    Component Value Date/Time   PHART 7.23 (L) 03/12/2024 0230   PCO2ART 20 (L) 03/12/2024 0230   PO2ART 105 03/12/2024 0230   HCO3 8.4 (L) 03/12/2024 0230   ACIDBASEDEF 17.4 (H) 03/12/2024 0230   O2SAT 100 03/12/2024 0230     Coagulation Profile: Recent Labs  Lab 03/10/24 0425  INR 1.4*    Cardiac Enzymes: No results for input(s): CKTOTAL, CKMB, CKMBINDEX, TROPONINI in the last 168 hours.  HbA1C: No results found for: HGBA1C  CBG: Recent Labs  Lab 03/11/24 1541 03/11/24 1955 03/12/24 0008 03/12/24 0035 03/12/24 0258  GLUCAP 133* 88 46* 146* 139*         The patient is critically ill due to refractory septic shock and febrile neutropenia.  Critical care was necessary to treat or prevent imminent or life-threatening deterioration. Critical care time was spent by me on the following activities: development of a treatment plan with the patient and/or surrogate as well as nursing, discussions with consultants, evaluation of the patient's response to treatment, examination of the patient, obtaining a history from the patient or surrogate, ordering and performing treatments and interventions, ordering and review of laboratory studies, ordering and review of radiographic studies, review of telemetry data including pulse oximetry, re-evaluation of patient's condition and participation in multidisciplinary rounds.   I personally spent 80 minutes providing critical care not including any separately billable procedures.  Dorn Chill, MD [facility] Office: [phone]   See Amion for personal pager PCCM on call pager [phone] until 7pm. Please call Elink 7p-7a. [phone]               "

## 2024-03-12 NOTE — Progress Notes (Signed)
 eLink Physician-Brief Progress Note Patient Name: Edwin Cook DOB: December 18, 1995 MRN: [id number]   Date of Service  03/12/2024  HPI/Events of Note  ABG 7.41/26/98 On PRVC 24/580/40%/5 PEEP Peak pressure 27 TV at 8 cc/kg IBW Not breathing over the set rate of 24 Ongoing CRRT  eICU Interventions  Decrease rate to 22 Have room to target 6 cc/kg IBW RT to be informed     Intervention Category Intermediate Interventions: Other:  [name] 03/12/2024, 10:53 PM

## 2024-03-12 NOTE — Plan of Care (Signed)
  Problem: Clinical Measurements: Goal: Respiratory complications will improve Outcome: Progressing Goal: Cardiovascular complication will be avoided Outcome: Progressing   Problem: Nutrition: Goal: Adequate nutrition will be maintained Outcome: Progressing   Problem: Elimination: Goal: Will not experience complications related to urinary retention Outcome: Progressing   Problem: Pain Managment: Goal: General experience of comfort will improve and/or be controlled Outcome: Progressing

## 2024-03-12 NOTE — Consult Note (Signed)
 [facility] Nephrology Consultation Note  Requesting MD: Dr. Kara Carrier Reason for consult: Lactic Acidosis  HPI:  Edwin Cook is a 28 y.o. male with past medical history significant for Ewing sarcoma currently on chemotherapy seen as a consultation for severe lactic acidosis and multiple electrolytes abnormalities. The patient has Ewing sarcoma initially diagnosed in 2010 with a right hip and leg pain then recurred in 2021 with metastatic pulmonary sarcoma, receiving multiple courses of chemotherapy.  The last chemo was about a week ago with vincristine/irinotecan/temozolomide with a CT chest showing mild interval progression in lung mets.  He follows with oncologist at [facility].  He presented to the ER with shortness of breath, fever chills and diarrhea.  On arrival to the ER he was hypotensive with systolic BP in 40s and lactic acidosis.  Initially treated with fluid resuscitation.  He is currently on Levophed , vasopressin  for the shock.  Blood culture growing Pseudomonas, receiving cefepime .  Intubated for respiratory failure. The labs showed persistent hypokalemia around 2.6/3 creatinine level 1.1, BUN 11, CO2 9, hypocalcemia, albumin  2.4.  Lactic acid level more than 9.  He does have pancytopenia with WBC of 0.3, platelet 12.  After discussion with ICU team we have started CRRT overnight.  He has been tolerating well.  Patient's mother and aunt at the bedside. He is receiving multiple rounds of potassium chloride  IV, from tube and also received calcium  repletion.  Discussed with ICU team.  PMHx:   Past Medical History:  Diagnosis Date   Cancer (HCC)    Ewing's sarcoma    Past Surgical History:  Procedure Laterality Date   FEMUR TUMOR RESECTION     KNEE SURGERY     KNEE SURGERY     MEDIPORT INSERTION, SINGLE     MEDIPORT REMOVAL      Family Hx:  Family History  Problem Relation Age of Onset   Diabetes Mother    Hypertension Mother    Thyroid disease  Mother    Asthma Father     Social History:  reports that he has been smoking e-cigarettes. He has never used smokeless tobacco. He reports current alcohol use. He reports that he does not use drugs.  Allergies: Allergies[1]  Medications: Prior to Admission medications  Medication Sig Start Date End Date Taking? Authorizing Provider  cefixime (SUPRAX) 400 MG CAPS capsule Take 1 capsule (400mg ) by mouth daily starting 2 days prior to the start of chemotherapy, and continue through Day 8 (10 days total) 12/24/23 06/21/24 Yes [provider]  cetirizine (ZYRTEC) 10 MG tablet Take 10 mg by mouth daily. 12/01/23 11/30/24 Yes [provider]  dexamethasone  (DECADRON ) 4 MG tablet Take 2 tablets (8mg ) by mouth in the morning for 2 days after chemotherapy, then as directed. 06/05/21  Yes [provider]  dextromethorphan-guaiFENesin  (MUCINEX  DM) 30-600 MG 12hr tablet Take 1 tablet by mouth 2 (two) times daily. Patient taking differently: Take 1 tablet by mouth 2 (two) times daily as needed for cough. 08/23/21  Yes Lynwood Lenis, PA-C  diphenoxylate -atropine  (LOMOTIL ) 2.5-0.025 MG tablet Take 1 tablet by mouth 4 (four) times daily as needed for diarrhea or loose stools. 02/29/24  Yes [provider]  fluticasone  (FLONASE ) 50 MCG/ACT nasal spray Place 2 sprays into both nostrils daily. 08/23/21  Yes Lynwood Lenis, PA-C  K Phos  Mono-Sod Phos [place] & Mono (PHOSPHOROUS) 155-852-130 MG TABS Take 3 tablets by mouth 2 (two) times daily.   Yes [provider]  loperamide  (IMODIUM ) 2 MG  capsule Take 1 capsule (2 mg total) by mouth 4 (four) times daily as needed for Diarrhea 12/28/23  Yes [provider]  LORazepam (ATIVAN) 0.5 MG tablet Take 0.5 mg by mouth at bedtime as needed 03/03/22  Yes [provider]  methylphenidate  (RITALIN ) 5 MG tablet Take 1 tablet (5 mg total) by mouth once daily 12/28/23  Yes [provider]  ondansetron  (ZOFRAN ) 8 MG tablet  Take 1 tablet (8mg ) by mouth twice daily on Days 6 & 7, then take 1 tablet every 8 hours as needed for Nausea and vomiting 06/10/21  Yes [provider]  Oxycodone  HCl 10 MG TABS Take 10 mg by mouth every 4 (four) hours. 05/19/23  Yes [provider]  potassium chloride  SA (KLOR-CON  M) 20 MEQ tablet Take 2 tablets (40 mEq total) by mouth 2 (two) times a day. 12/29/23  Yes [provider]  prochlorperazine  (COMPAZINE ) 10 MG tablet Take 1 tablet (10 mg total) by mouth every 8 (eight) hours as needed. 06/20/21  Yes [provider]  sertraline  (ZOLOFT ) 100 MG tablet Take 100 mg by mouth daily. 12/06/21  Yes [provider]  temozolomide (TEMODAR) 100 MG capsule Take 200mg  (2 capsules) PO on Days 1-5 of a 21-day cycle. Administer 60 minutes prior to irinotecan. 12/24/23  Yes [provider]  amoxicillin  (AMOXIL ) 500 MG capsule Take 1 capsule (500 mg total) by mouth 3 (three) times daily. Patient not taking: Reported on 03/10/2024 08/23/21   Lynwood Lenis, PA-C  methocarbamol (ROBAXIN) 750 MG tablet Take 1 tablet (750 mg total) by mouth 3 (three) times daily as needed Patient not taking: Reported on 03/10/2024 10/20/23   [provider]    I have reviewed the patient's current medications.  Labs: Renal Panel: Recent Labs  Lab 03/10/24 1015 03/10/24 1555 03/11/24 0830 03/11/24 1433 03/11/24 2105 03/12/24 0320 03/12/24 0527 03/12/24 0528  NA 132*   < > 137  --  137 137 136 136  K 3.7   < > 2.8*  --  2.6* 3.0* 3.0* 3.0*  CL 104   < > 99  --  99 102 98 98  CO2 15*   < > 10*  --  9* 9* 12* 12*  GLUCOSE 132*   < > 188*  --  91 147* 139* 138*  BUN 7   < > 9  --  11 12 11 11   CREATININE 1.07   < > 0.99  --  1.05 1.16 1.18 1.17  CALCIUM  7.2*   < > 6.5*  --  6.6* 6.1* 6.0* 6.1*  MG 1.2*  --   --  1.6* 2.2 2.0 2.0  --   PHOS 2.1*  --   --  5.3*  --   --  3.6 3.6   < > = values in this interval not displayed.     CBC:    Latest Ref Rng & Units  03/12/2024    5:27 AM 03/11/2024    4:57 AM 03/10/2024   10:15 AM  CBC  WBC 4.0 - 10.5 K/uL 0.3  0.1  0.4   Hemoglobin 13.0 - 17.0 g/dL 8.3  8.7  8.7   Hematocrit 39.0 - 52.0 % 24.5  25.7  26.5   Platelets 150 - 400 K/uL 12  50  87      Anemia Panel:  Recent Labs    03/10/24 0435 03/10/24 1015 03/11/24 0457 03/12/24 0527  HGB 8.6* 8.7* 8.7* 8.3*  MCV 98.2 95.3  93.1 94.6    Recent Labs  Lab 03/10/24 0425 03/10/24 1015 03/11/24 1346 03/12/24 0528  AST 19 22 694*  --   ALT 32 30 528*  --   ALKPHOS 86 72 58  --   BILITOT 0.5 0.5 1.9*  --   PROT 3.9* 4.0* 4.3*  --   ALBUMIN  2.5* 2.5* 3.0* 2.4*    No results found for: HGBA1C  ROS: Unable to obtain review of system.  Physical Exam: Vitals:   03/12/24 0700 03/12/24 0715  BP:    Pulse:  (!) 57  Resp: (!) 28 (!) 28  Temp: 99 F (37.2 C) 99 F (37.2 C)  SpO2:  (!) 87%     General exam: Critically ill looking male, intubated.  Sedated. Respiratory system: Coarse breath sound bilateral. Cardiovascular system: S1 & S2 heard, RRR.  Trace pedal edema. Gastrointestinal system: Abdomen is nondistended, soft  Central nervous system: Sedated.. Extremities: Trace edema. Skin: No rashes, lesions or ulcers Psychiatry: Sedated.  Assessment/Plan:  # Severe lactic acidosis likely due to combination of chemotherapy/tumor burden, septic shock.  Patient is on multiple pressors and antibiotics.  We started CRRT overnight with sodium bicarb as replacement fluid.  We will closely monitor the labs and adjust CRRT prescription as necessary.  # Septic shock, Pseudomonas bacteremia in neutropenic patient: On pressors, antibiotics per ICU team.  # Hypokalemia, hypocalcemia: Continue to replete KCl, calcium .  Monitor lab.  Magnesium  level acceptable.  # Pancytopenia due to chemo, sepsis with history of Ewing sarcoma with metastasis.  Received chemo recently.  Replacement of blood products, on filgrastim  per oncology  team.  Discussed with the family members and ICU team. Thank you for the consult we will continue to follow.  Eulis Salazar Amelie Romney 03/12/2024, 7:45 AM  [employer].       [1]  Allergies Allergen Reactions   Morphine    Morphine And Codeine Rash

## 2024-03-12 NOTE — Procedures (Signed)
 Central Venous Catheter Insertion Procedure Note  Edwin Cook  [id number]  07/13/1995  Date:03/12/2024  Time:2:12 AM   Provider Performing:Francene Mcerlean Maree   Procedure: Insertion of Non-tunneled Central Venous Catheter(36556)with US  guidance (23062)  Trialysis Catheter  Indication(s) Hemodialysis  Consent Risks of the procedure as well as the alternatives and risks of each were explained to the patient and/or caregiver.  Consent for the procedure was obtained and is signed in the bedside chart  Anesthesia Topical only with 1% lidocaine    Timeout Verified patient identification, verified procedure, site/side was marked, verified correct patient position, special equipment/implants available, medications/allergies/relevant history reviewed, required imaging and test results available.  Sterile Technique Maximal sterile technique including full sterile barrier drape, hand hygiene, sterile gown, sterile gloves, mask, hair covering, sterile ultrasound probe cover (if used).  Procedure Description Area of catheter insertion was cleaned with chlorhexidine  and draped in sterile fashion.   With real-time ultrasound guidance a HD catheter was placed into the left internal jugular vein.  Nonpulsatile blood flow and easy flushing noted in all ports.  The catheter was sutured in place and sterile dressing applied.  Complications/Tolerance None; patient tolerated the procedure well. Chest X-ray is ordered to verify placement for internal jugular or subclavian cannulation.  Chest x-ray is not ordered for femoral cannulation.  EBL Minimal  Specimen(s) None

## 2024-03-12 NOTE — Progress Notes (Signed)
 Brief Nutrition Support Note  New consult received for pt starting on CRRT. Evaluated earlier in the day by RD team and started on enteral nutrition. Will increase protein being provided by enteral feeds to account for losses associated with CRRT and renal MVI. For last full assessment, see RD note from 12/26.  Will follow-up as planned.   INTERVENTION:   Continue to monitor magnesium , potassium, and phosphorus daily for total of at least 3 days, MD to replete as needed, as pt is at risk for refeeding syndrome.  Thiamine  100 mg daily for 7 days  Continue to advance tube feeding via OGT: Vital 1.5 at 65 ml/h (1560 ml per day) Prosource TF20 60 ml 1x/d Provides 2420 kcal, 125 gm protein, 1192 ml free water  daily Once at goal consider Banatrol BID to aid in diarrhea Renal MVI daily   Edwin Cook, RD, LDN, CNSC Registered Dietitian II Please reach out via secure chat

## 2024-03-12 NOTE — Progress Notes (Signed)
 NT drew 2 CBGs from patient's fingertips which resulted in low readings. Sample drawn off line by RN and CBG 90. CBGs will be drawn off line in future due to patient's poor circulation in hands.

## 2024-03-13 DIAGNOSIS — J189 Pneumonia, unspecified organism: Secondary | ICD-10-CM | POA: Diagnosis not present

## 2024-03-13 DIAGNOSIS — R6521 Severe sepsis with septic shock: Secondary | ICD-10-CM | POA: Diagnosis not present

## 2024-03-13 DIAGNOSIS — C4022 Malignant neoplasm of long bones of left lower limb: Secondary | ICD-10-CM | POA: Diagnosis not present

## 2024-03-13 DIAGNOSIS — Z66 Do not resuscitate: Secondary | ICD-10-CM

## 2024-03-13 DIAGNOSIS — A419 Sepsis, unspecified organism: Secondary | ICD-10-CM | POA: Diagnosis not present

## 2024-03-13 DIAGNOSIS — Z515 Encounter for palliative care: Secondary | ICD-10-CM

## 2024-03-13 LAB — CULTURE, BLOOD (ROUTINE X 2)

## 2024-03-13 LAB — LACTIC ACID, PLASMA
Lactic Acid, Venous: >9 mmol/L (ref 0.5–1.9)
Lactic Acid, Venous: >9 mmol/L (ref 0.5–1.9)

## 2024-03-13 LAB — CULTURE, RESPIRATORY W GRAM STAIN: Gram Stain: NONE SEEN

## 2024-03-13 LAB — BASIC METABOLIC PANEL WITH GFR
Anion gap: 29 — ABNORMAL HIGH (ref 5–15)
Anion gap: 34 — ABNORMAL HIGH (ref 5–15)
BUN: 11 mg/dL (ref 6–20)
BUN: 10 mg/dL (ref 6–20)
CO2: 17 mmol/L — ABNORMAL LOW (ref 22–32)
CO2: 16 mmol/L — ABNORMAL LOW (ref 22–32)
Calcium: 6.6 mg/dL — ABNORMAL LOW (ref 8.9–10.3)
Calcium: 5.9 mg/dL — CL (ref 8.9–10.3)
Chloride: 84 mmol/L — ABNORMAL LOW (ref 98–111)
Chloride: 83 mmol/L — ABNORMAL LOW (ref 98–111)
Creatinine, Ser: 1.32 mg/dL — ABNORMAL HIGH (ref 0.61–1.24)
Creatinine, Ser: 1.27 mg/dL — ABNORMAL HIGH (ref 0.61–1.24)
GFR, Estimated: >60 mL/min
GFR, Estimated: >60 mL/min
Glucose, Bld: 103 mg/dL — ABNORMAL HIGH (ref 70–99)
Glucose, Bld: 67 mg/dL — ABNORMAL LOW (ref 70–99)
Potassium: 4.2 mmol/L (ref 3.5–5.1)
Potassium: 5.3 mmol/L — ABNORMAL HIGH (ref 3.5–5.1)
Sodium: 129 mmol/L — ABNORMAL LOW (ref 135–145)
Sodium: 133 mmol/L — ABNORMAL LOW (ref 135–145)

## 2024-03-13 LAB — BLOOD GAS, ARTERIAL
Acid-base deficit: 8 mmol/L — ABNORMAL HIGH (ref 0.0–2.0)
Acid-base deficit: 8 mmol/L — ABNORMAL HIGH (ref 0.0–2.0)
Acid-base deficit: 13.9 mmol/L — ABNORMAL HIGH (ref 0.0–2.0)
Bicarbonate: 16.4 mmol/L — ABNORMAL LOW (ref 20.0–28.0)
Bicarbonate: 16.2 mmol/L — ABNORMAL LOW (ref 20.0–28.0)
Bicarbonate: 10.6 mmol/L — ABNORMAL LOW (ref 20.0–28.0)
Drawn by: 11249
FIO2: 40 %
FIO2: 40 %
MECHVT: 580 mL
O2 Saturation: 97.1 %
O2 Saturation: 96.9 %
O2 Saturation: 90.2 %
PEEP: 5 cmH2O
PEEP: 5 cmH2O
Patient temperature: 36.7
Patient temperature: 36.9
Patient temperature: 37
RATE: 22 {breaths}/min
RATE: 22 {breaths}/min
pCO2 arterial: 29 mmHg — ABNORMAL LOW (ref 32–48)
pCO2 arterial: 28 mmHg — ABNORMAL LOW (ref 32–48)
pCO2 arterial: 22 mmHg — ABNORMAL LOW (ref 32–48)
pH, Arterial: 7.36 (ref 7.35–7.45)
pH, Arterial: 7.37 (ref 7.35–7.45)
pH, Arterial: 7.29 — ABNORMAL LOW (ref 7.35–7.45)
pO2, Arterial: 74 mmHg — ABNORMAL LOW (ref 83–108)
pO2, Arterial: 75 mmHg — ABNORMAL LOW (ref 83–108)
pO2, Arterial: 58 mmHg — ABNORMAL LOW (ref 83–108)

## 2024-03-13 LAB — RENAL FUNCTION PANEL
Albumin: 2.4 g/dL — ABNORMAL LOW (ref 3.5–5.0)
Albumin: 2.2 g/dL — ABNORMAL LOW (ref 3.5–5.0)
Anion gap: 29 — ABNORMAL HIGH (ref 5–15)
Anion gap: 33 — ABNORMAL HIGH (ref 5–15)
BUN: 11 mg/dL (ref 6–20)
BUN: 10 mg/dL (ref 6–20)
CO2: 17 mmol/L — ABNORMAL LOW (ref 22–32)
CO2: 16 mmol/L — ABNORMAL LOW (ref 22–32)
Calcium: 6.6 mg/dL — ABNORMAL LOW (ref 8.9–10.3)
Calcium: 6.7 mg/dL — ABNORMAL LOW (ref 8.9–10.3)
Chloride: 84 mmol/L — ABNORMAL LOW (ref 98–111)
Chloride: 83 mmol/L — ABNORMAL LOW (ref 98–111)
Creatinine, Ser: 1.34 mg/dL — ABNORMAL HIGH (ref 0.61–1.24)
Creatinine, Ser: 1.27 mg/dL — ABNORMAL HIGH (ref 0.61–1.24)
GFR, Estimated: >60 mL/min
GFR, Estimated: >60 mL/min
Glucose, Bld: 99 mg/dL (ref 70–99)
Glucose, Bld: 66 mg/dL — ABNORMAL LOW (ref 70–99)
Phosphorus: 4.3 mg/dL (ref 2.5–4.6)
Phosphorus: 6 mg/dL — ABNORMAL HIGH (ref 2.5–4.6)
Potassium: 4.2 mmol/L (ref 3.5–5.1)
Potassium: 5.3 mmol/L — ABNORMAL HIGH (ref 3.5–5.1)
Sodium: 130 mmol/L — ABNORMAL LOW (ref 135–145)
Sodium: 132 mmol/L — ABNORMAL LOW (ref 135–145)

## 2024-03-13 LAB — CBC WITH DIFFERENTIAL/PLATELET
Abs Immature Granulocytes: 0.18 K/uL — ABNORMAL HIGH (ref 0.00–0.07)
Basophils Absolute: 0 K/uL (ref 0.0–0.1)
Basophils Relative: 1 %
Eosinophils Absolute: 0 K/uL (ref 0.0–0.5)
Eosinophils Relative: 1 %
HCT: 26.8 % — ABNORMAL LOW (ref 39.0–52.0)
Hemoglobin: 9.1 g/dL — ABNORMAL LOW (ref 13.0–17.0)
Immature Granulocytes: 11 %
Lymphocytes Relative: 6 %
Lymphs Abs: 0.1 K/uL — ABNORMAL LOW (ref 0.7–4.0)
MCH: 31.8 pg (ref 26.0–34.0)
MCHC: 34 g/dL (ref 30.0–36.0)
MCV: 93.7 fL (ref 80.0–100.0)
Monocytes Absolute: 0.1 K/uL (ref 0.1–1.0)
Monocytes Relative: 6 %
Neutro Abs: 1.2 K/uL — ABNORMAL LOW (ref 1.7–7.7)
Neutrophils Relative %: 75 %
Platelets: 20 K/uL — CL (ref 150–400)
RBC: 2.86 MIL/uL — ABNORMAL LOW (ref 4.22–5.81)
RDW: 16 % — ABNORMAL HIGH (ref 11.5–15.5)
WBC: 1.6 K/uL — ABNORMAL LOW (ref 4.0–10.5)
nRBC: 16.1 % — ABNORMAL HIGH (ref 0.0–0.2)

## 2024-03-13 LAB — GLUCOSE, CAPILLARY
Glucose-Capillary: 79 mg/dL (ref 70–99)
Glucose-Capillary: 109 mg/dL — ABNORMAL HIGH (ref 70–99)

## 2024-03-13 LAB — MAGNESIUM: Magnesium: 2.4 mg/dL (ref 1.7–2.4)

## 2024-03-13 MED ORDER — SODIUM BICARBONATE 8.4 % IV SOLN: 50.0000 meq | Freq: Once | INTRAVENOUS | Status: AC

## 2024-03-13 MED ORDER — PRISMASOL BGK 2/3.5 32-2-3.5 MEQ/L EC SOLN: Status: DC

## 2024-03-13 MED ORDER — EPINEPHRINE HCL 5 MG/250ML IV SOLN IN NS
INTRAVENOUS | Status: AC
  Filled 2024-03-13: qty 250

## 2024-03-13 MED ORDER — CALCIUM GLUCONATE-NACL 2-0.675 GM/100ML-% IV SOLN
2.0000 g | Freq: Once | INTRAVENOUS | Status: AC
  Administered 2024-03-13: 2000 mg via INTRAVENOUS
  Filled 2024-03-13: qty 100

## 2024-03-13 MED ORDER — VANCOMYCIN HCL 1250 MG/250ML IV SOLN
1250.0000 mg | INTRAVENOUS | Status: DC
  Filled 2024-03-13: qty 250

## 2024-03-13 MED ORDER — DEXTROSE 50 % IV SOLN
12.5000 g | INTRAVENOUS | Status: AC
  Administered 2024-03-13: 12.5 g via INTRAVENOUS
  Filled 2024-03-13: qty 50

## 2024-03-13 MED ORDER — PHENYLEPHRINE HCL-NACL 20-0.9 MG/250ML-% IV SOLN
INTRAVENOUS | Status: AC
  Filled 2024-03-13: qty 250

## 2024-03-13 MED ORDER — METHYLENE BLUE (ANTIDOTE) 1 % IV SOLN
100.0000 mg | Freq: Once | INTRAVENOUS | Status: AC
  Administered 2024-03-13: 100 mg via INTRAVENOUS
  Filled 2024-03-13: qty 10

## 2024-03-13 MED ORDER — EPINEPHRINE HCL 5 MG/250ML IV SOLN IN NS
0.5000 ug/min | INTRAVENOUS | Status: DC
  Administered 2024-03-13: 0.5 ug/min via INTRAVENOUS

## 2024-03-13 MED ORDER — DIGOXIN 0.25 MG/ML IJ SOLN
0.2500 mg | Freq: Once | INTRAMUSCULAR | Status: AC
  Administered 2024-03-13: 0.25 mg via INTRAVENOUS
  Filled 2024-03-13: qty 2

## 2024-03-13 MED ORDER — SODIUM BICARBONATE 8.4 % IV SOLN
INTRAVENOUS | Status: AC
  Administered 2024-03-13: 50 meq
  Filled 2024-03-13: qty 50

## 2024-03-13 MED ORDER — LINEZOLID 600 MG/300ML IV SOLN: 600.0000 mg | Freq: Two times a day (BID) | INTRAVENOUS | Status: DC

## 2024-03-13 MED ORDER — EPINEPHRINE 1 MG/10ML IV SOSY
PREFILLED_SYRINGE | INTRAVENOUS | Status: AC
  Filled 2024-03-13: qty 30

## 2024-03-14 LAB — ADAMTS13 ACTIVITY REFLEX

## 2024-03-14 LAB — CALCIUM, IONIZED: Calcium, Ionized, Serum: 3.5 mg/dL — ABNORMAL LOW (ref 4.5–5.6)

## 2024-03-14 LAB — BPAM PLATELET PHERESIS
Blood Product Expiration Date: 202512282359
ISSUE DATE / TIME: 202512271042
Unit Type and Rh: 5100

## 2024-03-14 LAB — PREPARE PLATELET PHERESIS: Unit division: 0

## 2024-03-14 LAB — ADAMTS13 ACTIVITY: Adamts 13 Activity: 45.1 % — ABNORMAL LOW

## 2024-03-15 LAB — ASPERGILLUS ANTIBODY BY IMMUNODIFF
Aspergillus flavus: NEGATIVE
Aspergillus fumigatus, IgG: NEGATIVE
Aspergillus niger: NEGATIVE

## 2024-03-16 LAB — FUNGITELL BETA-D-GLUCAN: Fungitell Value:: 77.406 pg/mL

## 2024-03-17 LAB — CULTURE, BLOOD (ROUTINE X 2): Culture: NO GROWTH

## 2024-03-17 NOTE — Consult Note (Signed)
 " Palliative Medicine Inpatient Consult Note  Consulting Provider: Kara Dorn NOVAK, MD   Reason for consult:   Palliative Care Consult Services Palliative Medicine Consult  Reason for Consult? 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 His oncologist is at [facility], Dr. Grilley-olson Juneko. Admitted with increasing shortness if breath, fever, chills identified to be in septic shock & have LUL and LLL PNA requiring intubation.   Palliative care has been asked to offer addition support to patients family.   Clinical Assessment/Goals of Care:  *Please note that this is a verbal dictation therefore any spelling or grammatical errors are due to the Dragon Medical One system interpretation.  I have reviewed medical records including EPIC notes, labs and imaging, received report from bedside RN, assessed the patient.    I met with patient's mother, Katheryn to further discuss diagnosis prognosis, GOC, EOL wishes, disposition and options.   I introduced Palliative Medicine as specialized medical care for people living with serious illness. It focuses on providing relief from the symptoms and stress of a serious illness. The goal is to improve quality of life for both the patient and the family.  Medical History Review and Understanding:  A review of hunters past medical history significant for Ewing sarcoma which was identified when he was 29 years old.  He had done well until 2021 when he had recurrence with metastasis.   Social History:  Kandon lives in [place], [place]  he is not married nor does he have children.  He is one of three children and is the middle child.  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   Per his mom he enjoys baseball and  is a fan of the [employer].  He does have Christian faith.  Functional and Nutritional State:  Preceding patient's recent treatment and pneumonia he was fully functional at BADLs and IADLs.  Advance Directives:  A detailed discussion was had today regarding advanced directives.  Adonis does not have advanced directives on file in [place] at this time.   Code Status:  Concepts specific to code status, artifical feeding and hydration, continued IV antibiotics and rehospitalization was had.  The difference between a aggressive medical intervention path  and a palliative comfort care path for this patient at this time was had.   Jerren is a DNAR at this time, change which was made earlier in the day by Dr. Kara.   Discussion:  We reviewed that Trexton had done well for a number of years post treatment. His recurrence of his sarcoma came as a surprise and Zaquan had gotten various treatments. He has been cared for at [facility]. His family note that at this time he is fighting a severe infection after having received chemotherapy recently.   Patients mother shares she does not know what to say, do or expect. She shares that she at this time is hoping for a miracle. I was able to sit with her and offer support under the current circumstances.   Discussed the role of chaplain support during times like this which patients mother was in agreement with.   Reviewed gently potential outcomes which patients mother shares awareness of.   Discussed the importance of continued conversation with family and their  medical providers regarding  overall plan of care and treatment options, ensuring decisions are within the context of the patients values and GOCs.  Decision Maker: Vanvoorhis,Lori: Mother, Emergency Contact: [phone] (Mobile)   SUMMARY OF RECOMMENDATIONS   DNAR  Continue aggressive measures inclusive of CRRT  Allowing time for outcomes  Emotional support provided  The PMT will continue  to follow along during this difficult time  Code Status/Advance Care Planning: DNAR   Palliative Prophylaxis:  Aspiration, Bowel Regimen, Delirium Protocol, Frequent Pain Assessment, Oral Care, Palliative Wound Care, and Turn Reposition  Additional Recommendations (Limitations, Scope, Preferences): Continue current measures  Psycho-social/Spiritual:  Desire for further Chaplaincy support: Yes Additional Recommendations: Discussion of expectation in the settling of severe illness   Prognosis: Poor overall given severity of illness  Discharge Planning: Discharge plan is uncertain.   Vitals:   2024/03/17 0600 2024-03-17 0700  BP:    Pulse:  (!) 149  Resp: (!) 27 (!) 31  Temp: 98.2 F (36.8 C) 98.2 F (36.8 C)  SpO2:  (!) 84%    Intake/Output Summary (Last 24 hours) at 03/17/2024 0819 Last data filed at 03-17-2024 0800 Gross per 24 hour  Intake 9270 ml  Output 6235.03 ml  Net 3034.97 ml   Last Weight  Most recent update: 03/12/2024  5:09 AM    Weight  105.7 kg (233 lb 0.4 oz)             LABS: CBC:    Component Value Date/Time   WBC 1.6 (L) 17-Mar-2024 0041   HGB 9.1 (L) 03/17/2024 0041   HCT 26.8 (L) March 17, 2024 0041   PLT 20 (LL) 03/17/24 0041   MCV 93.7 03-17-24 0041   MCV 89.4 07/28/2014 1822   NEUTROABS 1.2 (L) Mar 17, 2024 0041   LYMPHSABS 0.1 (L) Mar 17, 2024 0041   MONOABS 0.1 Mar 17, 2024 0041   EOSABS 0.0 03-17-2024 0041   BASOSABS 0.0 2024-03-17 0041   Comprehensive Metabolic Panel:    Component Value Date/Time   NA 132 (L) 2024/03/17 0520   NA 133 (L) 03-17-24 0520   K 5.3 (H) 2024/03/17 0520   K 5.3 (H) 03-17-24 0520   CL 83 (L) 2024-03-17 0520   CL 83 (L) 03/17/2024 0520   CO2 16 (L) 03/17/24 0520   CO2 16 (L) 03/17/24 0520   BUN 10 Mar 17, 2024 0520   BUN 10 March 17, 2024 0520   CREATININE 1.27 (H) 03-17-24 0520   CREATININE 1.27 (H) 2024/03/17 0520   GLUCOSE 66 (L) 03-17-24 0520   GLUCOSE 67 (L) Mar 17, 2024 0520   CALCIUM  6.7  (L) March 17, 2024 0520   CALCIUM  5.9 (LL) 03/17/24 0520   AST 3,465 (H) 03/12/2024 0806   ALT 2,750 (H) 03/12/2024 0806   ALKPHOS 114 03/12/2024 0806   BILITOT 2.8 (H) 03/12/2024 0806   PROT 3.4 (L) 03/12/2024 0806   ALBUMIN  2.2 (L) 03-17-2024 0520   Gen:  Young Caucasian M critically ill   HEENT: ETT, dry mucous membranes CV: irregular rate and rhythm  PULM:  On mechanical ventilator EXT: (+) dependent edema   Neuro: Somnolent  PPS: 10%   This conversation/these recommendations were discussed with patient primary care team, Dr. Kara ______________________________________________________ [facility] Palliative Medicine Team Team Cell Phone: [phone] Please utilize secure chat with additional questions, if there is no response within 30 minutes please call the above phone number  Billing based on MDM: High  Palliative Medicine Team providers are available by phone from 7am to 7pm daily and can be reached through the team cell  phone.  Should this patient require assistance outside of these hours, please call the patient's attending physician.  "

## 2024-03-17 NOTE — Progress Notes (Signed)
" °   2024/04/05 0600  Spiritual Encounters  Type of Visit Initial  Care provided to: Pt and family  Conversation partners present during encounter Nurse  Referral source Family;Nurse (RN/NT/LPN);Clinical staff  Reason for visit Religious ritual  OnCall Visit Yes  Spiritual Framework  Presenting Themes Caregiving needs;Impactful experiences and emotions;Rituals and practive  Values/beliefs [employer] [employer] - divorced mother and father at bedside  Community/Connection Family  Patient Stress Factors None identified  Family Stress Factors Exhausted;Loss of control;Major life changes;Loss (Anticipatory Grief)  Interventions  Spiritual Care Interventions Made Established relationship of care and support;Compassionate presence;Prayer;Encouragement;Supported grief process  Intervention Outcomes  Outcomes Reduced fear;Patient family open to resources   Chaplain met with patient and family at bedside - two parents, randparent and siblings.  Discussion regarding DNR status and essential care at this time.  Provided spiritual care and comfort for their Anticipatory Grief.   "

## 2024-03-17 NOTE — Progress Notes (Signed)
 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 His last chemo was a few days ago with Vincristine/ironotecan/temozolomide with CT chest with mild interval progression in lung mets, stable dominant pleural mets. Patient completed 10 days of prophylactic antibiotics post chemotherapy. His oncologist is at [facility], Dr. Grilley-olson Juneko. He has been dealing with diarrheas in the last few months.   This time he came with diarrheas, one day of increasing shortness if breath, fever, chills and came to the ER for evaluation.   On arrival to the ER, he became hypotensive requiring up to 40 of levophed , lactic acidosis up to 8. He has received 5L of fluids and antibiotics. CXR showed L lung decrease aeration. He has port cath. During my encounter on the ER, patient is awake, able to give information, BP has improved and pressors were stopped on arrival to the ICU. He had 2 episodes of small diarrhea. His course was complicated with requiring pressors, severe lactic acidosis, intubated.   Pertinent  Medical History   Past Medical History:  Diagnosis Date   Cancer [facility])    Ewing's sarcoma     Significant Hospital Events: Including procedures, antibiotic start and stop dates in addition to other pertinent events   03/10/24 - on arrival off pressors, however he suddenly became hypotension, with levophed  to 40, vasopressin  was added it, epinephrine , stress dose steroids and broad spectrum antibiotics.  Femoral line was placed. Tachypnea increased, requiring intubation. 03/12/24 started CRRT, pressors increased in the evening and more so overnight  Interim History / Subjective:      Objective    Blood  pressure (!) 87/74, pulse (!) 149, temperature 98.2 F (36.8 C), resp. rate (!) 31, height 5' 10 (1.778 m), weight 105.7 kg, SpO2 (!) 84%.    Vent Mode: PRVC FiO2 (%):  [30 %-40 %] 40 % Set Rate:  [22 bmp-28 bmp] 22 bmp Vt Set:  [580 mL] 580 mL PEEP:  [5 cmH20] 5 cmH20 Plateau Pressure:  [23 cmH20-26 cmH20] 25 cmH20   Intake/Output Summary (Last 24 hours) at 25-Mar-2024 0739 Last data filed at 03/25/2024 0700 Gross per 24 hour  Intake 9203.75 ml  Output 6505.13 ml  Net 2698.62 ml   Filed Weights   03/10/24 0846 03/11/24 0500 03/12/24 0500  Weight: 99.4 kg 104.1 kg 105.7 kg    Examination: Physical Exam Constitutional:      Appearance: He is ill-appearing and toxic-appearing.  HENT:     Head: Normocephalic.     Mouth/Throat:     Mouth: Mucous membranes are moist.  Eyes:     Pupils:     Right eye: Pupil is not reactive (fixed, dilated).     Left eye: Pupil is not reactive (fixed, dilated).  Cardiovascular:     Rate and Rhythm: Regular rhythm. Tachycardia present.  Pulmonary:     Breath sounds: No wheezing or rhonchi.     Comments: On the ventilator comfortable. synchronized Abdominal:     General: There is no distension.     Palpations: Abdomen is soft.     Comments: Bowel sounds absent  Musculoskeletal:        General: Normal range of motion.  Skin:    Capillary  Refill: Capillary refill takes more than 3 seconds.     Coloration: Skin is pale.  Neurological:     Comments: On the ventilator. sedated      Resolved problem list   Assessment and Plan   Neuro Concern for significant brain injury given fixed and dilated pupils Sedation remains on  Respiratory LUL, and LLL pneumonia - Pseudomonas on resp culture LLL Ewing sarcoma with mets with recent chemotherapy -on MV PRVC, Lung protection ventilation -PAD protocol -No SAT/SBT today  Cardiac Septic shock with severe lactic acidosis Prolonged QTc EF 40-45%. With global hypokinesis. RV is moderately  reduced. Small pericardial effusion Pseudomonas on blood cx On NE+vaso+epi, HC 100mg  BID, giving methylene blue  this AM Lines: port, femoral art line, left internal jugular HD cath Replace electrolytes as able, goal K 4 or greater, Mg 2 or greater  ID Septic shock 2/2 pseudomonas bacteremia Neutropenic fever On chemotherapy for metastatic lung Ewing sarcoma on recent chemo VIT Vincristine/ironotecan/temozolomide  Pseudomonas bacteremia MRSA +ve Patient with extensive history of metastatic lung Ewing sarcoma since 2021 with recent chemotherapy who came due to neutropenic fever. He has septic shock due to pseudomonas pneumonia and bacteremia On cefepime  based on micro data, switched from vanc + meropenem  12/26 Add back vancomycin  Continue stress dose steroids Pending sensitivity blood cx Repeat Blood cultures 12/27 pending Echo with no vegetation  Renal Lactic acidosis HAGMA AKI  Electrolyte Abnormalities Severe lactic acidosis likely related to tumor burden, septic shock, shock liver. CTA/P negative, no bowel perf, normal liver morphology, not on metformin.  Started on CRRT 12/27 and on bicarb drip. Nephrology following Continue foley Strict I/O Replace electrolytes as needed  Hem/onc Pancytopenia 2/2 chemo and sepsis LLL Ewing sarcoma with mets with recent chemotherapy Monitor blood count.  Transfuse unit of platelets this AM Started filgrastrim x1 dose 12/26> AB Neutrophils 100 > trials with limited data, no clear benefit in mortality. NCCN guideline suggest GCSF maybe considered in patients with febrile neutropenia, however ID guidelines no clear benefit.  No signs of tumor lysis syndrome but he has been on chemo recently.  Will discuss filgastrim with heme/onc team 4T score 2, low probability of HIT Check DIC panel  Gastro Shock liver Stool PCR negative Progressive shock liver with hypoglycemia and persistent lactic acidosis  Best Practice (right click and Reselect  all SmartList Selections daily)   Diet/type: NPO DVT prophylaxis prophylactic heparin   Pressure ulcer(s): N/A GI prophylaxis: N/A Lines: yes and it is still needed Foley:  N/A Code Status:  DNR Last date of multidisciplinary goals of care discussion [updated family at bedside. Reviewed his progressive multiorgan failure and how the exam show fixed and dilated pupils which is evidence of severe brain injury. His code status has been changed to DNR. They wish to continue current aggressive care measures. Informed them I consulted palliative care for family support.]  Labs   CBC: Recent Labs  Lab 03/10/24 0435 03/10/24 1015 03/11/24 0457 03/12/24 0527 03/12/24 0805 03/12/24 1515 2024-03-20 0041  WBC 0.4* 0.4* 0.1* 0.3*  --  0.6* 1.6*  NEUTROABS 0.1* 0.1*  --  0.2*  --   --  1.2*  HGB 8.6* 8.7* 8.7* 8.3*  --  8.1* 9.1*  HCT 26.9* 26.5* 25.7* 24.5*  --  23.7* 26.8*  MCV 98.2 95.3 93.1 94.6  --  92.6 93.7  PLT 110* 87* 50* 12* 12* 25* 20*    Basic Metabolic Panel: Recent Labs  Lab 03/11/24 1433 03/11/24 2105 03/12/24 0320 03/12/24 9472  03/12/24 0528 03/12/24 1645 11-Apr-2024 0041 April 11, 2024 0042 04/11/24 0520  NA  --  137 137 136 136 132* 129* 130* 133*  132*  K  --  2.6* 3.0* 3.0* 3.0* 3.8 4.2 4.2 5.3*  5.3*  CL  --  99 102 98 98 90* 84* 84* 83*  83*  CO2  --  9* 9* 12* 12* 17* 17* 17* 16*  16*  GLUCOSE  --  91 147* 139* 138* 118* 103* 99 67*  66*  BUN  --  11 12 11 11 11 11 11 10  10   CREATININE  --  1.05 1.16 1.18 1.17 1.16 1.32* 1.34* 1.27*  1.27*  CALCIUM   --  6.6* 6.1* 6.0* 6.1* 6.0* 6.6* 6.6* 5.9*  6.7*  MG 1.6* 2.2 2.0 2.0  --   --  2.4  --   --   PHOS 5.3*  --   --  3.6 3.6 3.5  --  4.3 6.0*   GFR: Estimated Creatinine Clearance: 105.5 mL/min (A) (by C-G formula based on SCr of 1.27 mg/dL (H)). Recent Labs  Lab 03/10/24 1015 03/10/24 1555 03/11/24 0457 03/11/24 1109 03/12/24 0527 03/12/24 0804 03/12/24 1515 03/12/24 1645 03/12/24 2317  04-11-24 0041 04-11-24 0455  PROCALCITON 27.70  --   --   --   --   --   --   --   --   --   --   WBC 0.4*  --  0.1*  --  0.3*  --  0.6*  --   --  1.6*  --   LATICACIDVEN  --    < > >9.0*   < > >9.0* >9.0*  --  >9.0* >9.0*  --  >9.0*   < > = values in this interval not displayed.    Liver Function Tests: Recent Labs  Lab 03/10/24 0425 03/10/24 1015 03/11/24 1346 03/12/24 0528 03/12/24 0806 03/12/24 1645 2024/04/11 0042 04/11/24 0520  AST 19 22 694*  --  3,465*  --   --   --   ALT 32 30 528*  --  2,750*  --   --   --   ALKPHOS 86 72 58  --  114  --   --   --   BILITOT 0.5 0.5 1.9*  --  2.8*  --   --   --   PROT 3.9* 4.0* 4.3*  --  3.4*  --   --   --   ALBUMIN  2.5* 2.5* 3.0* 2.4* 2.3* 2.4* 2.4* 2.2*   Recent Labs  Lab 03/12/24 0806  LIPASE 11   No results for input(s): AMMONIA in the last 168 hours.  ABG    Component Value Date/Time   PHART 7.37 04-11-24 0537   PCO2ART 28 (L) 04/11/24 0537   PO2ART 75 (L) 2024-04-11 0537   HCO3 16.2 (L) 2024-04-11 0537   ACIDBASEDEF 8.0 (H) Apr 11, 2024 0537   O2SAT 96.9 04-11-24 0537     Coagulation Profile: Recent Labs  Lab 03/10/24 0425 03/12/24 0805  INR 1.4* >10.0*    Cardiac Enzymes: No results for input(s): CKTOTAL, CKMB, CKMBINDEX, TROPONINI in the last 168 hours.  HbA1C: No results found for: HGBA1C  CBG: Recent Labs  Lab 03/12/24 1642 03/12/24 1959 03/12/24 2351 April 11, 2024 0408 04/11/2024 0655  GLUCAP 109* 113* 101* 79 109*         The patient is critically ill due to refractory septic shock, multiorgan failure and febrile neutropenia.  Critical care was necessary to treat or  prevent imminent or life-threatening deterioration. Critical care time was spent by me on the following activities: development of a treatment plan with the patient and/or surrogate as well as nursing, discussions with consultants, evaluation of the patient's response to treatment, examination of the patient, obtaining a  history from the patient or surrogate, ordering and performing treatments and interventions, ordering and review of laboratory studies, ordering and review of radiographic studies, review of telemetry data including pulse oximetry, re-evaluation of patient's condition and participation in multidisciplinary rounds.   I personally spent 60 minutes providing critical care not including any separately billable procedures.  Dorn Chill, MD [facility] Office: [phone]   See Amion for personal pager PCCM on call pager [phone] until 7pm. Please call Elink 7p-7a. [phone]               "

## 2024-03-17 NOTE — Progress Notes (Signed)
 Edwin Cook  Assessment/ Plan: Pt is a 29 y.o. 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   # Severe lactic acidosis likely due to combination of chemotherapy/tumor burden, septic shock.  Patient is on multiple pressors and antibiotics.  Currently on sodium bicarbonate  as replacement fluid.  Not much improvement with CRRT.  Clinically declining.  Noted patient is DNR and palliative care team is consulted.    # Septic shock, Pseudomonas bacteremia in neutropenic patient: On pressors, antibiotics per ICU team.  The pressors requirement is going up.   # Hypokalemia, hypocalcemia: Required repletion of electrolytes.  Changed dialysate fluid to 2K/3.5 calcium .  Monitor lab.  # Pancytopenia due to chemo, sepsis with history of Ewing sarcoma with metastasis.  Received chemo recently.  Replacement of blood products, on filgrastim  per oncology team.  Clinically declining and not much response with current treatment in ICU.  Multiple family members at the bedside including ICU status.  DNR/DNI, palliative care involved.  Subjective: Seen and examined in ICU.  Pressors requirement going up, on multiple drips, unresponsive.  Surrounded by multiple family members.  Objective Vital signs in last 24 hours: Vitals:   2024/03/22 0545 March 22, 2024 0600 2024-03-22 0700 March 22, 2024 0754  BP:      Pulse: (!) 154  (!) 149   Resp: (!) 23 (!) 27 (!) 31 17  Temp: 98.4 F (36.9 C) 98.2 F (36.8 C) 98.2 F (36.8 C) 98.1 F (36.7 C)  TempSrc:    Esophageal  SpO2: 91%  (!) 84%   Weight:      Height:       Weight change:   Intake/Output Summary (Last 24 hours) at 03/22/2024 0850 Last data filed at 03-22-2024 0800 Gross per 24 hour  Intake 9270 ml  Output 6235.03 ml  Net 3034.97 ml       Labs: RENAL PANEL Recent Labs   Lab 03/11/24 1433 03/11/24 2105 03/12/24 0320 03/12/24 0527 03/12/24 0528 03/12/24 0806 03/12/24 1645 March 22, 2024 0041 03/22/24 0042 March 22, 2024 0520  NA  --  137 137 136 136  --  132* 129* 130* 133*  132*  K  --  2.6* 3.0* 3.0* 3.0*  --  3.8 4.2 4.2 5.3*  5.3*  CL  --  99 102 98 98  --  90* 84* 84* 83*  83*  CO2  --  9* 9* 12* 12*  --  17* 17* 17* 16*  16*  GLUCOSE  --  91 147* 139* 138*  --  118* 103* 99 67*  66*  BUN  --  11 12 11 11   --  11 11 11 10  10   CREATININE  --  1.05 1.16 1.18 1.17  --  1.16 1.32* 1.34* 1.27*  1.27*  CALCIUM   --  6.6* 6.1* 6.0* 6.1*  --  6.0* 6.6* 6.6* 5.9*  6.7*  MG 1.6* 2.2 2.0 2.0  --   --   --  2.4  --   --   PHOS 5.3*  --   --  3.6 3.6  --  3.5  --  4.3 6.0*  ALBUMIN   --   --   --   --  2.4* 2.3* 2.4*  --  2.4* 2.2*    Liver Function Tests: Recent Labs  Lab 03/10/24 1015 03/11/24 1346 03/12/24 0528 03/12/24 9193 03/12/24 1645 Mar 22, 2024 0042 March 22, 2024 0520  AST 22 694*  --  3,465*  --   --   --   ALT 30 528*  --  2,750*  --   --   --   ALKPHOS 72 58  --  114  --   --   --   BILITOT 0.5 1.9*  --  2.8*  --   --   --   PROT 4.0* 4.3*  --  3.4*  --   --   --   ALBUMIN  2.5* 3.0*   < > 2.3* 2.4* 2.4* 2.2*   < > = values in this interval not displayed.   Recent Labs  Lab 03/12/24 0806  LIPASE 11   No results for input(s): AMMONIA in the last 168 hours. CBC: Recent Labs    03/10/24 1015 03/11/24 0457 03/12/24 0527 03/12/24 1515 04-02-2024 0041  HGB 8.7* 8.7* 8.3* 8.1* 9.1*  MCV 95.3 93.1 94.6 92.6 93.7    Cardiac Enzymes: No results for input(s): CKTOTAL, CKMB, CKMBINDEX, TROPONINI in the last 168 hours. CBG: Recent Labs  Lab 03/12/24 1642 03/12/24 1959 03/12/24 2351 2024-04-02 0408 04-02-24 0655  GLUCAP 109* 113* 101* 79 109*    Iron Studies: No results for input(s): IRON, TIBC, TRANSFERRIN, FERRITIN in the last 72 hours. Studies/Results: DG CHEST PORT 1 VIEW Result Date: 03/12/2024 EXAM: 1 VIEW(S)  XRAY OF THE CHEST 03/12/2024 02:00:00 AM COMPARISON: 03/11/2024 CLINICAL HISTORY: Vascular dialysis catheter in place FINDINGS: LINES, TUBES AND DEVICES: Endotracheal tube in place with tip 2.0 cm above the carina. Enteric tube in place with tip reaching the diaphragm and terminating below the field of view. Left IJ catheter in place with tip at the brachiocephalic-caval confluence. Right chest wall port in place with tip in the right atrium. Esophageal probe in place with tip in the distal esophagus. LUNGS AND PLEURA: Similar left lung diffuse airspace infiltrate. Resultant retrocardiac opacification. Increasing interstitial infiltrate within the right lung. Small left pleural effusion. No pneumothorax. HEART AND MEDIASTINUM: No acute abnormality of the cardiac and mediastinal silhouettes. BONES AND SOFT TISSUES: No acute osseous abnormality. IMPRESSION: 1. Left internal jugular catheter tip at the brachiocephalic-caval confluence, with endotracheal tube, enteric tube, right chest wall port, and esophageal probe in place as described. 2. Similar diffuse left lung airspace infiltrate with resultant retrocardiac opacification. 3. Increasing interstitial infiltrate in the right lung. 4. Small left pleural effusion. Electronically signed by: Dorethia Molt MD 03/12/2024 02:17 AM EST RP Workstation: [id number]    Medications: Infusions:  amiodarone  60 mg/hr (2024-04-02 0800)   ceFEPime  (MAXIPIME ) IV Stopped (Apr 02, 2024 0617)   dextrose  50 mL/hr at 04-02-24 0800   epinephrine  15 mcg/min (02-Apr-2024 0800)   famotidine  (PEPCID ) IV Stopped (03/12/24 2253)   feeding supplement (VITAL 1.5 CAL) Stopped (04-02-24 0805)   fentaNYL  infusion INTRAVENOUS 100 mcg/hr (04-02-2024 0800)   norepinephrine  (LEVOPHED ) Adult infusion 60 mcg/min (Apr 02, 2024 0800)   PrismaSol  BGK 2/3.5     propofol  (DIPRIVAN ) infusion Stopped (04/02/2024 0038)   sodium bicarbonate  150 mEq in sterile water  1,150 mL infusion 150 mL/hr at 02-Apr-2024 0800    sodium bicarbonate  150 mEq in sterile water  1,150 mL infusion 150 mL/hr at Apr 02, 2024 0644   sodium bicarbonate  150 mEq in sterile water  1,150 mL infusion 150 mL/hr at April 02, 2024 0645   vancomycin      vasopressin  0.04 Units/min (04-02-24 0800)    Scheduled Medications:  Chlorhexidine  Gluconate Cloth  6 each Topical Daily   EPINEPHrine        feeding supplement (PROSource TF20)  60  mL Per Tube Daily   fentaNYL  (SUBLIMAZE ) injection  25-50 mcg Intravenous Once   hydrocortisone  sod succinate (SOLU-CORTEF ) inj  100 mg Intravenous Q12H   ipratropium-albuterol   3 mL Nebulization Q4H   multivitamin  1 tablet Per Tube QHS   mupirocin  ointment   Nasal BID   mouth rinse  15 mL Mouth Rinse Q2H   phenylephrine        sertraline   100 mg Per Tube Daily   sodium chloride  flush  10-40 mL Intracatheter Q12H   sodium chloride  HYPERTONIC  4 mL Nebulization Q8H   thiamine   100 mg Per Tube Daily    have reviewed scheduled and prn medications.  Physical Exam: General: Critically ill looking male, unresponsive, intubated Heart:RRR, s1s2 nl Lungs: Coarse breath sound. Abdomen:soft, non-distended Extremities: Dependent edema present Neurology: Sedated.  Edwin Cook 2024-04-11,8:50 AM  LOS: 3 days

## 2024-03-17 NOTE — Progress Notes (Signed)
 Pharmacy Antibiotic Note  Edwin Cook is a 29 y.o. male admitted on 03/10/2024 with sepsis.  Pharmacy has been consulted for vancomycin  dosing.  Day #4 of coverage for pseudomonas bacteremia. Due to persistent septic shock restarting vancomycin  for broader coverage. Continues on CRRT, did receive a few doses of vancomycin  with last dose on 12/26.   Plan: Continue cefepime  2g IV Q8h Start vancomycin  1,250mg  IV Q24h Monitor clinical picture, CRRT / renal function, vanc levels prn F/U C&S  Height: 5' 10 (177.8 cm) Weight: 105.7 kg (233 lb 0.4 oz) IBW/kg (Calculated) : 73  Temp (24hrs), Avg:98.5 F (36.9 C), Min:95.5 F (35.3 C), Max:99.5 F (37.5 C)  Recent Labs  Lab 03/10/24 1015 03/10/24 1555 03/11/24 0457 03/11/24 0830 03/12/24 0527 03/12/24 0528 03/12/24 0804 03/12/24 1515 03/12/24 1645 03/12/24 2317 April 08, 2024 0041 04-08-24 0042 04/08/24 0455 2024/04/08 0520  WBC 0.4*  --  0.1*  --  0.3*  --   --  0.6*  --   --  1.6*  --   --   --   CREATININE 1.07   < > 0.99   < > 1.18 1.17  --   --  1.16  --  1.32* 1.34*  --  1.27*  1.27*  LATICACIDVEN  --    < > >9.0*   < > >9.0*  --  >9.0*  --  >9.0* >9.0*  --   --  >9.0*  --    < > = values in this interval not displayed.    Estimated Creatinine Clearance: 105.5 mL/min (A) (by C-G formula based on SCr of 1.27 mg/dL (H)).    Allergies[1]  Antimicrobials this admission: Cefepime /flagyl  12/25 x1  Vanc 12/25 >>12/26 12/25 meropenem >>12/26 12/26 cefepime >>  Dose adjustments this admission:   Microbiology results: 12/25 GI PCR: negative 12/25 sputum cx: Pseudomonas (pan sensitive) 12/25 bcx: 3/4 Pseudomonas 12/25 MRSA PCR: detected 12/25 Resp PCR: neg  12/27 BCx (repeat): sent  Thank you for allowing pharmacy to be a part of this patients care.  Rankin Dee, PharmD, BCPS, BCIDP Clinical Pharmacist 04/08/2024 8:05 AM      [1]  Allergies Allergen Reactions   Morphine    Morphine And Codeine Rash

## 2024-03-17 NOTE — IPAL (Signed)
" °  Interdisciplinary Goals of Care Family Meeting   Date carried out: Mar 17, 2024  Location of the meeting: Unit  Member's involved: Physician, Bedside Registered Nurse, and Family Member or next of kin  Durable Power of Attorney or acting medical decision maker: Katheryn Minus  Discussion: We discussed goals of care for [employer] .  I spoke with patient's parents and they both agree on changing code status to DNR/DNI but they wish to continue aggressive medical care in the meantime.     Code status:   Code Status: Do not attempt resuscitation (DNR) PRE-ARREST INTERVENTIONS DESIRED   Disposition: Continue current acute care  Time spent for the meeting: 10 minutes    Dorn KATHEE Chill, MD  17-Mar-2024, 7:30 AM   "

## 2024-03-17 NOTE — Progress Notes (Signed)
 eLink Physician-Brief Progress Note Patient Name: Edwin Cook DOB: Jan 31, 1996 MRN: [id number]   Date of Service  04-01-24  HPI/Events of Note  Patient noted to be back in rapid AFib w/ R/R. rate 120-133 did the same yesterday and got Amio bolus and drip that is still ongoing  eICU Interventions  Ordered a one time of of digoxin  0.25 mg Norepinephrine  titrated up to keep MAP >65 CRRT on hold Electrolytes sent Also with concern for hypoactive bowel sounds, checking residuals Discussed with BSRN     Intervention Category Intermediate Interventions: Arrhythmia - evaluation and management  [name] 2024-04-01, 12:47 AM

## 2024-03-17 NOTE — Death Summary Note (Signed)
 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 year old male with history of metastatic ewing sarcoma undergoing chemotherapy with last treatment 12/19 who was admitted 03-16-24 with septic shock due to pseudomonas bacteremia and pneumonia. He developed shock liver, acute kidney injury and pancytopenia. He was treated with levophed , vasopressin  and broad spectrum antibiotics including cefepime , meropenem  vancomycin . He developed persistent lactic acidosis in which CRRT was started without much effect. He continued to develop refractory shock on 03/19/2025. Epinephrine  was added and a dose of methylene blue  was given without response. On exam patient developed fixed and dilated pupil concerning for catastrophic brain injury. Family provided updates throughout, code status updated to DNR and his heart stopped at 9:45am and was pronounced.   Pertinent Labs and Studies  Significant Diagnostic Studies DG CHEST PORT 1 VIEW Result Date: 03/12/2024 EXAM: 1 VIEW(S) XRAY OF THE CHEST 03/12/2024 02:00:00 AM COMPARISON: 03/11/2024 CLINICAL HISTORY: Vascular dialysis catheter in  place FINDINGS: LINES, TUBES AND DEVICES: Endotracheal tube in place with tip 2.0 cm above the carina. Enteric tube in place with tip reaching the diaphragm and terminating below the field of view. Left IJ catheter in place with tip at the brachiocephalic-caval confluence. Right chest wall port in place with tip in the right atrium. Esophageal probe in place with tip in the distal esophagus. LUNGS AND PLEURA: Similar left lung diffuse airspace infiltrate. Resultant retrocardiac opacification. Increasing interstitial infiltrate within the right lung. Small left pleural effusion. No pneumothorax. HEART AND MEDIASTINUM: No acute abnormality of the cardiac and mediastinal silhouettes. BONES AND SOFT TISSUES: No acute osseous abnormality. IMPRESSION: 1. Left internal jugular catheter tip at the brachiocephalic-caval confluence, with endotracheal tube, enteric tube, right chest wall port, and esophageal probe in place as described. 2. Similar diffuse left lung airspace infiltrate with resultant retrocardiac opacification. 3. Increasing interstitial infiltrate in the right lung. 4. Small left pleural effusion. Electronically signed by: Dorethia Molt MD 03/12/2024 02:17 AM EST RP Workstation: [id number]   DG CHEST PORT 1 VIEW Result Date: 03/11/2024 CLINICAL DATA:  Ventilator dependence. EXAM: PORTABLE CHEST 1 VIEW COMPARISON:  Radiograph and CT yesterday FINDINGS: Endotracheal tube tip 3 cm from the carina. Enteric tube tip below the diaphragm not included in the field of view. Accessed right chest port projects over the right chest wall. Lower lung volumes from prior exam. Hazy opacity throughout the left hemithorax with pleural effusion and more confluent perihilar opacity, without significant interval change. Fiducial markers in the left perihilar lung. Allowing for differences in technique, the heart is stable in size. Right lung opacities on prior CT are not well demonstrated by radiograph. IMPRESSION: 1.  Endotracheal tube tip 3 cm from the carina. 2. Lower lung volumes from prior exam. Hazy  opacity throughout the left hemithorax with pleural effusion and more confluent perihilar opacity, without significant interval change. Electronically Signed   By: Andrea Gasman M.D.   On: 03/11/2024 18:53   DG CHEST PORT 1 VIEW Result Date: 03/10/2024 CLINICAL DATA:  Acute hypoxemic respiratory failure. OG tube and endotracheal tube. EXAM: PORTABLE CHEST 1 VIEW COMPARISON:  Chest CTA earlier today FINDINGS: Endotracheal tube tip is 2.7 cm from the carina. Tip and side port of the enteric tube below the diaphragm in the stomach. Right chest port remains in place. Dense consolidation in the central left lung with left pleural effusion. Presumed left lung fiducial markers. No definite radiographic correlate to the ground-glass and centrilobular nodules on CT in the right lung. No pneumothorax. IMPRESSION: 1. Endotracheal tube tip 2.7 cm from the carina. 2. Tip and side port of the enteric tube below the diaphragm in the stomach. 3. Dense consolidation in the central left lung with left pleural effusion. Electronically Signed   By: Andrea Gasman M.D.   On: 03/10/2024 20:01   CT ABDOMEN PELVIS W CONTRAST Result Date: 03/10/2024 EXAM: CT ABDOMEN AND PELVIS WITH CONTRAST 03/10/2024 05:37:34 PM TECHNIQUE: CT of the abdomen and pelvis was performed with the administration of 100 mL of iohexol  (OMNIPAQUE ) 350 MG/ML injection. Multiplanar reformatted images are provided for review. Automated exposure control, iterative reconstruction, and/or weight-based adjustment of the mA/kV was utilized to reduce the radiation dose to as low as reasonably achievable. COMPARISON: None available. CLINICAL HISTORY: Diarrheas, septic shock with 3 pressors, r/o abd perf. FINDINGS: LOWER CHEST: For findings above the diaphragm, please see the separately dictated report for the chest CT, which was performed concurrently. LIVER: The liver is  unremarkable. GALLBLADDER AND BILE DUCTS: Gallbladder is unremarkable. No biliary ductal dilatation. SPLEEN: No acute abnormality. PANCREAS: No acute abnormality. ADRENAL GLANDS: No acute abnormality. KIDNEYS, URETERS AND BLADDER: 1.4 cm left interpolar region cyst, which is of intermediate density. No stones in the kidneys or ureters. No hydronephrosis. No perinephric or periureteral stranding. Urinary bladder is unremarkable. GI AND BOWEL: Stomach demonstrates no acute abnormality. The appendix was visualized and is decompressed and normal. There is no bowel obstruction. PERITONEUM AND RETROPERITONEUM: No ascites. No free air. VASCULATURE: Aorta is normal in caliber. LYMPH NODES: No lymphadenopathy. REPRODUCTIVE ORGANS: No acute abnormality. BONES AND SOFT TISSUES: Right hip arthroplasty is anatomically aligned without dislocation. 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 No acute osseous abnormality. No focal soft tissue abnormality. IMPRESSION: 1. No acute findings in the abdomen or pelvis. 2. Intermediate density 1.4 cm cystic lesion in the interpolar region of the left kidney, indeterminate. Given the patient's age, this may represent an proteinaceous or hemorrhagic cyst. Attention and follow-up imaging is recommended to document stability. Electronically signed by: Rogelia Myers MD 03/10/2024 06:16 PM EST RP Workstation: [id number]   CT Angio Chest Pulmonary Embolism (PE) W or WO Contrast Result Date: 03/10/2024 EXAM: CTA CHEST 03/10/2024 05:37:34 PM TECHNIQUE: CTA of the chest was performed without and with the administration of 100 mL of intravenous iohexol  (OMNIPAQUE ) 350 MG/ML injection. Multiplanar reformatted images are provided for review. MIP images are provided for review. Automated exposure control, iterative reconstruction, and/or weight based adjustment of the mA/kV was utilized to reduce the radiation dose to as low as reasonably achievable. COMPARISON: None available.  CLINICAL HISTORY: Rule out PE. Dyspnea, fever, and multiple episodes of diarrhea. Now with concerns for febrile neutropenia. History of Ewing sarcoma status post recent chemotherapy. FINDINGS: PULMONARY ARTERIES: No acute  pulmonary embolism. Apparent filling defects within the segmental and subsegmental branches of the pulmonary arteries in the right lower lobe, favored to be artifactual from motion. Main pulmonary artery is normal in caliber. MEDIASTINUM: Moderate volume pericardial effusion. No cardiomegaly. Right sided chest port in place, terminating in the right atrium. There is no acute abnormality of the thoracic aorta. LYMPH NODES: Scattered subcentimeter multistation mediastinal lymph nodes, likely reactive. No hilar or axillary lymphadenopathy. LUNGS AND PLEURA: Patchy ground glass airspace opacities in the anterior right upper lobe, minimally within the right middle lobe, and left lower lobe. Clustered centrilobular nodularity noted in the right lower lobe. On the left, there is dense airspace consolidation in the posterior left upper lobe with complete consolidation of the left lower lobe. Small left pleural effusion. No pneumothorax. UPPER ABDOMEN: For findings below the diaphragm, please see the separately dictated report for the CT of the abdomen and pelvis, which was performed concurrently. SOFT TISSUES AND BONES: Changes of the left humeral head, suggesting osteonecrosis with underlying collapse. On the right there is focal bony sclerosis, suggesting osteonecrosis without underlying subchondral collapse. Diffuse osteopenia. Mild compression deformities of T9 and T10. No retropulsion. IMPRESSION: 1. No pulmonary embolism. Apparent filling defects in the right lower lobe pulmonary arteries are favored to be artifactual from motion. 2. Dense airspace consolidation in the posterior left upper lobe with complete consolidation of the left lower lobe, with additional patchy ground glass opacities in the  right lung, consistent with multilobar pneumonia. 3. Clustered centrilobular nodularity in the medial basal right lower lobe , which may represent endobronchial spread of infection versus superimposed aspiration. 4. . Moderate volume pericardial effusion. No cardiomegaly. 5. Small left pleural effusion. No pneumothorax. 6. Age indeterminate, mild compression deformities of T9 and T10 . Correlation for point tenderness recommended to assess for acute or subacute compression fractures. Electronically signed by: Rogelia Myers MD 03/10/2024 06:08 PM EST RP Workstation: [id number]   ECHOCARDIOGRAM COMPLETE Result Date: 03/10/2024    ECHOCARDIOGRAM REPORT   Patient Name:   TIMONTHY HOVATER Zeiger Date of Exam: 03/10/2024 Medical Rec #:  [id number]         Height:       70.0 in Accession #:    [id number]        Weight:       219.1 lb Date of Birth:  April 02, 1995        BSA:          2.170 m Patient Age:    28 years          BP:           78/39 mmHg Patient Gender: M                 HR:           105 bpm. Exam Location:  Inpatient Procedure: 2D Echo, Cardiac Doppler, Color Doppler and Intracardiac            Opacification Agent (Both Spectral and Color Flow Doppler were            utilized during procedure). Indications:    R06.9 DOE  History:        Patient has no prior history of Echocardiogram examinations.                 Ewings sarcoma. Chemo.  Sonographer:    Ellouise Mose RDCS Referring Phys: [id number] [facility]  Sonographer Comments: Technically difficult study due to poor echo windows, suboptimal apical  window, suboptimal parasternal window and suboptimal subcostal window. Image acquisition challenging due to patient body habitus. IMPRESSIONS  1. Left ventricular ejection fraction, by estimation, is 40 to 45%. The left ventricle has mildly decreased function. The left ventricle demonstrates global hypokinesis. Left ventricular diastolic parameters were normal.  2. Right ventricular systolic function is  moderately reduced. The right ventricular size is normal. There is normal pulmonary artery systolic pressure.  3. A small pericardial effusion is present.  4. The mitral valve is grossly normal. No evidence of mitral valve regurgitation. No evidence of mitral stenosis.  5. The aortic valve is tricuspid. There is moderate calcification of the aortic valve. Aortic valve regurgitation is not visualized. No aortic stenosis is present.  6. The inferior vena cava is normal in size with greater than 50% respiratory variability, suggesting right atrial pressure of 3 mmHg. Conclusion(s)/Recommendation(s): Technically very limited study due to poor sound wave transmission. FINDINGS  Left Ventricle: Left ventricular ejection fraction, by estimation, is 40 to 45%. The left ventricle has mildly decreased function. The left ventricle demonstrates global hypokinesis. Definity  contrast agent was given IV to delineate the left ventricular  endocardial borders. The left ventricular internal cavity size was normal in size. There is no left ventricular hypertrophy. Left ventricular diastolic parameters were normal. Right Ventricle: The right ventricular size is normal. No increase in right ventricular wall thickness. Right ventricular systolic function is moderately reduced. There is normal pulmonary artery systolic pressure. The tricuspid regurgitant velocity is 2.20 m/s, and with an assumed right atrial pressure of 3 mmHg, the estimated right ventricular systolic pressure is 22.4 mmHg. Left Atrium: Left atrial size was normal in size. Right Atrium: Right atrial size was normal in size. Pericardium: A small pericardial effusion is present. Mitral Valve: The mitral valve is grossly normal. No evidence of mitral valve regurgitation. No evidence of mitral valve stenosis. Tricuspid Valve: The tricuspid valve is normal in structure. Tricuspid valve regurgitation is not demonstrated. No evidence of tricuspid stenosis. Aortic Valve: The  aortic valve is tricuspid. There is moderate calcification of the aortic valve. Aortic valve regurgitation is not visualized. No aortic stenosis is present. Pulmonic Valve: The pulmonic valve was grossly normal. Pulmonic valve regurgitation is mild. No evidence of pulmonic stenosis. Aorta: The aortic root is normal in size and structure. Venous: The inferior vena cava is normal in size with greater than 50% respiratory variability, suggesting right atrial pressure of 3 mmHg. IAS/Shunts: No atrial level shunt detected by color flow Doppler.  LEFT VENTRICLE PLAX 2D LVIDd:         4.20 cm     Diastology LVIDs:         3.30 cm     LV e' medial:    8.38 cm/s LV PW:         0.90 cm     LV E/e' medial:  8.9 LV IVS:        1.00 cm     LV e' lateral:   10.60 cm/s LVOT diam:     2.30 cm     LV E/e' lateral: 7.1 LV SV:         53 LV SV Index:   24 LVOT Area:     4.15 cm  LV Volumes (MOD) LV vol d, MOD A2C: 86.5 ml LV vol d, MOD A4C: 96.6 ml LV vol s, MOD A2C: 44.4 ml LV vol s, MOD A4C: 47.2 ml LV SV MOD A2C:     42.1 ml LV SV  MOD A4C:     96.6 ml LV SV MOD BP:      45.0 ml RIGHT VENTRICLE RV S prime:     14.00 cm/s  PULMONARY VEINS TAPSE (M-mode): 1.7 cm      Diastolic Velocity: 45.20 cm/s                             S/D Velocity:       1.10                             Systolic Velocity:  49.20 cm/s LEFT ATRIUM             Index        RIGHT ATRIUM           Index LA diam:        3.00 cm 1.38 cm/m   RA Area:     14.60 cm LA Vol (A2C):   22.9 ml 10.55 ml/m  RA Volume:   39.10 ml  18.02 ml/m LA Vol (A4C):   16.7 ml 7.70 ml/m LA Biplane Vol: 21.2 ml 9.77 ml/m  AORTIC VALVE LVOT Vmax:   99.50 cm/s LVOT Vmean:  62.600 cm/s LVOT VTI:    0.127 m  AORTA Ao Root diam: 3.20 cm Ao Asc diam:  3.00 cm MITRAL VALVE               TRICUSPID VALVE MV Area (PHT): 4.90 cm    TR Peak grad:   19.4 mmHg MV Decel Time: 155 msec    TR Vmax:        220.00 cm/s MV E velocity: 74.77 cm/s MV A velocity: 67.63 cm/s  SHUNTS MV E/A ratio:  1.11         Systemic VTI:  0.13 m                            Systemic Diam: 2.30 cm Toribio Fuel MD Electronically signed by Toribio Fuel MD Signature Date/Time: 03/10/2024/5:30:59 PM    Final    DG Abd 1 View Result Date: 03/10/2024 EXAM: 1 VIEW XRAY OF THE ABDOMEN 03/10/2024 10:12:00 AM COMPARISON: None available. CLINICAL HISTORY: Dyspnea FINDINGS: BOWEL: Gaseous distension of colon. Nonobstructive bowel gas pattern. SOFT TISSUES: No abnormal calcifications. BONES: Similar appearing right hip arthroplasty in place. No acute fracture. IMPRESSION: 1. Nonobstructive bowel gas pattern with gaseous distension of the colon. 2. No acute abdominal abnormality. Electronically signed by: Morgane Naveau MD 03/10/2024 02:00 PM EST RP Workstation: [id number]   DG Chest Port 1 View Result Date: 03/10/2024 EXAM: 1 VIEW(S) XRAY OF THE CHEST 03/10/2024 05:05:00 AM COMPARISON: Chest radiographs 08/12/2019 and earlier. CLINICAL HISTORY: 29 year old male with a history of sarcoma, fever, diarrhea, and sepsis. FINDINGS: LINES, TUBES AND DEVICES: Right chest Port-A-Cath in place with tip terminating over the right atrium. LUNGS AND PLEURA: Surgical clips overlie the left lung. Substantially improved left lung ventilation. But vague asymmetric left perihilar opacity. Small residual left pleural effusion difficult to exclude. Right lung appears negative. No pneumothorax. HEART AND MEDIASTINUM: No acute abnormality of the cardiac and mediastinal silhouettes. BONES AND SOFT TISSUES: No acute osseous abnormality. IMPRESSION: 1. Left lung ventilation improved from prior. Vague asymmetric left perihilar opacity now, and a small residual left pleural effusion is difficult to exclude. Electronically signed by: Helayne Hurst MD 03/10/2024 05:11 AM EST  RP Workstation: [id number]    Microbiology Recent Results (from the past 240 hours)  Blood Culture (routine x 2)     Status: Abnormal   Collection Time: 03/10/24  4:35 AM   Specimen:  BLOOD LEFT ARM  Result Value Ref Range Status   Specimen Description   Final    BLOOD LEFT ARM Performed at [facility], [address] [place], [place] [address]    Special Requests   Final    BOTTLES DRAWN AEROBIC AND ANAEROBIC Blood Culture results may not be optimal due to an inadequate volume of blood received in culture bottles Performed at [facility], [address] [place], [place] [address]    Culture  Setup Time   Final    GRAM NEGATIVE RODS AEROBIC BOTTLE ONLY CRITICAL RESULT CALLED TO, READ BACK BY AND VERIFIED WITH: PHARMD E JACKSON 04/12/2023 @ 0204 BY AB Performed at [facility], [address], [place], [place] [address]    Culture PSEUDOMONAS AERUGINOSA (A)  Final   Report Status 04-11-2024 FINAL  Final   Organism ID, Bacteria PSEUDOMONAS AERUGINOSA  Final      Susceptibility   Pseudomonas aeruginosa - MIC*    MEROPENEM  1 SENSITIVE Sensitive     CIPROFLOXACIN 0.25 SENSITIVE Sensitive     IMIPENEM 2 SENSITIVE Sensitive     CEFEPIME  4 SENSITIVE Sensitive     CEFTAZIDIME/AVIBACTAM 2 SENSITIVE Sensitive     CEFTOLOZANE/TAZOBACTAM 1 SENSITIVE Sensitive     TOBRAMYCIN <=1 SENSITIVE Sensitive     CEFTAZIDIME 2 SENSITIVE Sensitive     * PSEUDOMONAS AERUGINOSA  Blood Culture ID Panel (Reflexed)     Status: Abnormal   Collection Time: 03/10/24  4:35 AM  Result Value Ref Range Status   Enterococcus faecalis NOT DETECTED NOT DETECTED Final   Enterococcus Faecium NOT DETECTED NOT DETECTED Final   Listeria monocytogenes NOT DETECTED NOT DETECTED Final   Staphylococcus species NOT DETECTED NOT DETECTED Final   Staphylococcus aureus (BCID) NOT DETECTED NOT DETECTED Final   Staphylococcus epidermidis NOT DETECTED NOT DETECTED Final   Staphylococcus lugdunensis NOT DETECTED NOT DETECTED Final   Streptococcus species NOT DETECTED NOT DETECTED Final   Streptococcus agalactiae NOT DETECTED NOT DETECTED Final   Streptococcus  pneumoniae NOT DETECTED NOT DETECTED Final   Streptococcus pyogenes NOT DETECTED NOT DETECTED Final   A.calcoaceticus-baumannii NOT DETECTED NOT DETECTED Final   Bacteroides fragilis NOT DETECTED NOT DETECTED Final   Enterobacterales NOT DETECTED NOT DETECTED Final   Enterobacter cloacae complex NOT DETECTED NOT DETECTED Final   Escherichia coli NOT DETECTED NOT DETECTED Final   Klebsiella aerogenes NOT DETECTED NOT DETECTED Final   Klebsiella oxytoca NOT DETECTED NOT DETECTED Final   Klebsiella pneumoniae NOT DETECTED NOT DETECTED Final   Proteus species NOT DETECTED NOT DETECTED Final   Salmonella species NOT DETECTED NOT DETECTED Final   Serratia marcescens NOT DETECTED NOT DETECTED Final   Haemophilus influenzae NOT DETECTED NOT DETECTED Final   Neisseria meningitidis NOT DETECTED NOT DETECTED Final   Pseudomonas aeruginosa DETECTED (A) NOT DETECTED Final    Comment: CRITICAL RESULT CALLED TO, READ BACK BY AND VERIFIED WITH: PHARMD E JACKSON 04/12/2023 @ 0204 BY AB    Stenotrophomonas maltophilia NOT DETECTED NOT DETECTED Final   Candida albicans NOT DETECTED NOT DETECTED Final   Candida auris NOT DETECTED NOT DETECTED Final   Candida glabrata NOT DETECTED NOT DETECTED Final   Candida krusei NOT DETECTED NOT DETECTED Final  Candida parapsilosis NOT DETECTED NOT DETECTED Final   Candida tropicalis NOT DETECTED NOT DETECTED Final   Cryptococcus neoformans/gattii NOT DETECTED NOT DETECTED Final   CTX-M ESBL NOT DETECTED NOT DETECTED Final   Carbapenem resistance IMP NOT DETECTED NOT DETECTED Final   Carbapenem resistance KPC NOT DETECTED NOT DETECTED Final   Carbapenem resistance NDM NOT DETECTED NOT DETECTED Final   Carbapenem resistance VIM NOT DETECTED NOT DETECTED Final    Comment: Performed at [facility], [address], [place], [place] [address]  Blood Culture (routine x 2)     Status: Abnormal   Collection Time: 03/10/24  4:37 AM   Specimen: BLOOD LEFT ARM  Result  Value Ref Range Status   Specimen Description   Final    BLOOD LEFT ARM Performed at [facility], [address] [place], [place] [address]    Special Requests   Final    BOTTLES DRAWN AEROBIC AND ANAEROBIC Blood Culture results may not be optimal due to an inadequate volume of blood received in culture bottles Performed at [facility], [address] [place], [place] [address]    Culture  Setup Time   Final    GRAM NEGATIVE RODS AEROBIC BOTTLE ONLY CRITICAL VALUE NOTED.  VALUE IS CONSISTENT WITH PREVIOUSLY REPORTED AND CALLED VALUE.    Culture (A)  Final    PSEUDOMONAS AERUGINOSA SUSCEPTIBILITIES PERFORMED ON PREVIOUS CULTURE WITHIN THE LAST 5 DAYS. Performed at [facility], [address] [place], [place] [address]    Report Status 03/18/2024 FINAL  Final  Resp panel by RT-PCR (RSV, Flu A&B, Covid) Anterior Nasal Swab     Status: None   Collection Time: 03/10/24  4:40 AM   Specimen: Anterior Nasal Swab  Result Value Ref Range Status   SARS Coronavirus 2 by RT PCR NEGATIVE NEGATIVE Final    Comment: (NOTE) SARS-CoV-2 target nucleic acids are NOT DETECTED.  The SARS-CoV-2 RNA is generally detectable in upper respiratory specimens during the acute phase of infection. The lowest concentration of SARS-CoV-2 viral copies this assay can detect is 138 copies/mL. A negative result does not preclude SARS-Cov-2 infection and should not be used as the sole basis for treatment or other patient management decisions. A negative result may occur with  improper specimen collection/handling, submission of specimen other than nasopharyngeal swab, presence of viral mutation(s) within the areas targeted by this assay, and inadequate number of viral copies(<138 copies/mL). A negative result must be combined with clinical observations, patient history, and epidemiological information. The expected result is Negative.  Fact Sheet for Patients:   bloggercourse.com  Fact Sheet for Healthcare Providers:  seriousbroker.it  This test is no t yet approved or cleared by the [place]  FDA and  has been authorized for detection and/or diagnosis of SARS-CoV-2 by FDA under an Emergency Use Authorization (EUA). This EUA will remain  in effect (meaning this test can be used) for the duration of the COVID-19 declaration under Section 564(b)(1) of the Act, 21 U.S.C.section 360bbb-3(b)(1), unless the authorization is terminated  or revoked sooner.       Influenza A by PCR NEGATIVE NEGATIVE Final   Influenza B by PCR NEGATIVE NEGATIVE Final    Comment: (NOTE) The Xpert Xpress SARS-CoV-2/FLU/RSV plus assay is intended as an aid in the diagnosis of influenza from Nasopharyngeal swab specimens and should not be used as a sole basis for treatment. Nasal washings and aspirates are unacceptable for Xpert Xpress SARS-CoV-2/FLU/RSV testing.  Fact Sheet for Patients: bloggercourse.com  Fact Sheet for Healthcare Providers: seriousbroker.it  This test is not yet approved or cleared by the [place]  FDA and has been authorized for detection and/or diagnosis of SARS-CoV-2 by FDA under an Emergency Use Authorization (EUA). This EUA will remain in effect (meaning this test can be used) for the duration of the COVID-19 declaration under Section 564(b)(1) of the Act, 21 U.S.C. section 360bbb-3(b)(1), unless the authorization is terminated or revoked.     Resp Syncytial Virus by PCR NEGATIVE NEGATIVE Final    Comment: (NOTE) Fact Sheet for Patients: bloggercourse.com  Fact Sheet for Healthcare Providers: seriousbroker.it  This test is not yet approved or cleared by the [place]  FDA and has been authorized for detection and/or diagnosis of SARS-CoV-2 by FDA under an Emergency Use  Authorization (EUA). This EUA will remain in effect (meaning this test can be used) for the duration of the COVID-19 declaration under Section 564(b)(1) of the Act, 21 U.S.C. section 360bbb-3(b)(1), unless the authorization is terminated or revoked.  Performed at [facility], [address], [place], [place] [address]   MRSA Next Gen by PCR, Nasal     Status: Abnormal   Collection Time: 03/10/24  8:48 AM   Specimen: Nasal Mucosa; Nasal Swab  Result Value Ref Range Status   MRSA by PCR Next Gen DETECTED (A) NOT DETECTED Final    Comment: RESULT CALLED TO, READ BACK BY AND VERIFIED WITH: LOIS SPRUNG, RN 1710 03/10/24 BY VIN (NOTE) The GeneXpert MRSA Assay (FDA approved for NASAL specimens only), is one component of a comprehensive MRSA colonization surveillance program. It is not intended to diagnose MRSA infection nor to guide or monitor treatment for MRSA infections. Test performance is not FDA approved in patients less than 10 years old. Performed at [facility], [address] [place], [place] [address]   Gastrointestinal Panel by PCR , Stool     Status: None   Collection Time: 03/10/24 12:45 PM   Specimen: Stool  Result Value Ref Range Status   Campylobacter species NOT DETECTED NOT DETECTED Final   Plesimonas shigelloides NOT DETECTED NOT DETECTED Final   Salmonella species NOT DETECTED NOT DETECTED Final   Yersinia enterocolitica NOT DETECTED NOT DETECTED Final   Vibrio species NOT DETECTED NOT DETECTED Final   Vibrio cholerae NOT DETECTED NOT DETECTED Final   Enteroaggregative E coli (EAEC) NOT DETECTED NOT DETECTED Final   Enteropathogenic E coli (EPEC) NOT DETECTED NOT DETECTED Final   Enterotoxigenic E coli (ETEC) NOT DETECTED NOT DETECTED Final   Shiga like toxin producing E coli (STEC) NOT DETECTED NOT DETECTED Final   Shigella/Enteroinvasive E coli (EIEC) NOT DETECTED NOT DETECTED Final   Cryptosporidium NOT DETECTED NOT  DETECTED Final   Cyclospora cayetanensis NOT DETECTED NOT DETECTED Final   Entamoeba histolytica NOT DETECTED NOT DETECTED Final   Giardia lamblia NOT DETECTED NOT DETECTED Final   Adenovirus F40/41 NOT DETECTED NOT DETECTED Final   Astrovirus NOT DETECTED NOT DETECTED Final   Norovirus GI/GII NOT DETECTED NOT DETECTED Final   Rotavirus A NOT DETECTED NOT DETECTED Final   Sapovirus (I, II, IV, and V) NOT DETECTED NOT DETECTED Final    Comment: Performed at [facility], [address] [place], [place] [address]  Expectorated Sputum Assessment w Gram Stain, Rflx to Resp Cult     Status: None   Collection Time: 03/10/24  1:56 PM   Specimen: Sputum  Result Value Ref Range  Status   Specimen Description SPU  Final   Special Requests Immunocompromised  Final   Sputum evaluation   Final    THIS SPECIMEN IS ACCEPTABLE FOR SPUTUM CULTURE Performed at [facility], [address] [place], [place] [address]    Report Status 03/10/2024 FINAL  Final  Culture, Respiratory w Gram Stain     Status: None   Collection Time: 03/10/24  1:56 PM   Specimen: Sputum  Result Value Ref Range Status   Specimen Description   Final    SPU Performed at [facility], [address], [place], [place] [address]    Special Requests   Final    Immunocompromised Reflexed from [id number] Performed at [facility], [address] [place], [place] [address]    Gram Stain   Final    FEW WBC PRESENT, PREDOMINANTLY PMN FEW GRAM NEGATIVE RODS FEW GRAM POSITIVE COCCI FEW GRAM POSITIVE RODS RARE BUDDING YEAST SEEN Performed at [facility], [address], [place], [place] [address]    Culture FEW PSEUDOMONAS AERUGINOSA  Final   Report Status 03/12/2024 FINAL  Final   Organism ID, Bacteria PSEUDOMONAS AERUGINOSA  Final      Susceptibility   Pseudomonas aeruginosa - MIC*    MEROPENEM  1 SENSITIVE Sensitive     CIPROFLOXACIN 0.12 SENSITIVE  Sensitive     IMIPENEM 2 SENSITIVE Sensitive     PIP/TAZO Value in next row Sensitive      <=4 SENSITIVEThis is a modified FDA-approved test that has been validated and its performance characteristics determined by the reporting laboratory.  This laboratory is certified under the Clinical Laboratory Improvement Amendments CLIA as qualified to perform high complexity clinical laboratory testing.    CEFEPIME  Value in next row Sensitive      <=4 SENSITIVEThis is a modified FDA-approved test that has been validated and its performance characteristics determined by the reporting laboratory.  This laboratory is certified under the Clinical Laboratory Improvement Amendments CLIA as qualified to perform high complexity clinical laboratory testing.    CEFTAZIDIME/AVIBACTAM Value in next row Sensitive      <=4 SENSITIVEThis is a modified FDA-approved test that has been validated and its performance characteristics determined by the reporting laboratory.  This laboratory is certified under the Clinical Laboratory Improvement Amendments CLIA as qualified to perform high complexity clinical laboratory testing.    CEFTOLOZANE/TAZOBACTAM Value in next row Sensitive      <=4 SENSITIVEThis is a modified FDA-approved test that has been validated and its performance characteristics determined by the reporting laboratory.  This laboratory is certified under the Clinical Laboratory Improvement Amendments CLIA as qualified to perform high complexity clinical laboratory testing.    TOBRAMYCIN Value in next row Sensitive      <=4 SENSITIVEThis is a modified FDA-approved test that has been validated and its performance characteristics determined by the reporting laboratory.  This laboratory is certified under the Clinical Laboratory Improvement Amendments CLIA as qualified to perform high complexity clinical laboratory testing.    CEFTAZIDIME Value in next row Sensitive      <=4 SENSITIVEThis is a modified FDA-approved test  that has been validated and its performance characteristics determined by the reporting laboratory.  This laboratory is certified under the Clinical Laboratory Improvement Amendments CLIA as qualified to perform high complexity clinical laboratory testing.    * FEW PSEUDOMONAS AERUGINOSA  Expectorated Sputum Assessment w Gram Stain, Rflx to Resp Cult     Status: None  Collection Time: 03/11/24 12:29 PM   Specimen: Tracheal Aspirate; Sputum  Result Value Ref Range Status   Specimen Description TRACHEAL ASPIRATE  Final   Special Requests Immunocompromised  Final   Sputum evaluation   Final    THIS SPECIMEN IS ACCEPTABLE FOR SPUTUM CULTURE Performed at [facility], [address], [place], [place] [address]    Report Status 03/11/2024 FINAL  Final  Culture, Respiratory w Gram Stain     Status: None   Collection Time: 03/11/24 12:29 PM   Specimen: Tracheal Aspirate  Result Value Ref Range Status   Specimen Description   Final    TRACHEAL ASPIRATE Performed at [facility], [address], [place], [place] [address]    Special Requests   Final    Immunocompromised Reflexed from [phone] Performed at [facility], [address], [place], [place] [address]    Gram Stain   Final    NO WBC SEEN NO ORGANISMS SEEN Performed at [facility], [address], [place], [place] [address]    Culture RARE PSEUDOMONAS AERUGINOSA  Final   Report Status 2024-04-04 FINAL  Final   Organism ID, Bacteria PSEUDOMONAS AERUGINOSA  Final      Susceptibility   Pseudomonas aeruginosa - MIC*    MEROPENEM  1 SENSITIVE Sensitive     CIPROFLOXACIN 0.12 SENSITIVE Sensitive     IMIPENEM 2 SENSITIVE Sensitive     PIP/TAZO Value in next row Sensitive      <=4 SENSITIVEThis is a modified FDA-approved test that has been validated and its performance characteristics determined by the reporting laboratory.  This laboratory is certified under the Clinical  Laboratory Improvement Amendments CLIA as qualified to perform high complexity clinical laboratory testing.    CEFEPIME  Value in next row Sensitive      <=4 SENSITIVEThis is a modified FDA-approved test that has been validated and its performance characteristics determined by the reporting laboratory.  This laboratory is certified under the Clinical Laboratory Improvement Amendments CLIA as qualified to perform high complexity clinical laboratory testing.    CEFTAZIDIME/AVIBACTAM Value in next row Sensitive      <=4 SENSITIVEThis is a modified FDA-approved test that has been validated and its performance characteristics determined by the reporting laboratory.  This laboratory is certified under the Clinical Laboratory Improvement Amendments CLIA as qualified to perform high complexity clinical laboratory testing.    CEFTOLOZANE/TAZOBACTAM Value in next row Sensitive      <=4 SENSITIVEThis is a modified FDA-approved test that has been validated and its performance characteristics determined by the reporting laboratory.  This laboratory is certified under the Clinical Laboratory Improvement Amendments CLIA as qualified to perform high complexity clinical laboratory testing.    TOBRAMYCIN Value in next row Sensitive      <=4 SENSITIVEThis is a modified FDA-approved test that has been validated and its performance characteristics determined by the reporting laboratory.  This laboratory is certified under the Clinical Laboratory Improvement Amendments CLIA as qualified to perform high complexity clinical laboratory testing.    CEFTAZIDIME Value in next row Sensitive      <=4 SENSITIVEThis is a modified FDA-approved test that has been validated and its performance characteristics determined by the reporting laboratory.  This laboratory is certified under the Clinical Laboratory Improvement Amendments CLIA as qualified to perform high complexity clinical laboratory testing.    * RARE PSEUDOMONAS AERUGINOSA   Culture, blood (Routine X 2) w Reflex to ID Panel  Status: None (Preliminary result)   Collection Time: 03/12/24  8:30 AM   Specimen: BLOOD  Result Value Ref Range Status   Specimen Description   Final    BLOOD BLOOD LEFT ARM Performed at [facility], [address] [place], [place] [address]    Special Requests   Final    BOTTLES DRAWN AEROBIC ONLY Blood Culture results may not be optimal due to an inadequate volume of blood received in culture bottles Performed at [facility], [address], [place], [place] [address]    Culture   Final    NO GROWTH 2 DAYS Performed at [facility], [address], [place], [place] [address]    Report Status PENDING  Incomplete    Lab Basic Metabolic Panel: Recent Labs  Lab 03/11/24 1433 03/11/24 2105 03/12/24 0320 03/12/24 0527 03/12/24 0528 03/12/24 1645 03-16-2024 0041 2024/03/16 0042 16-Mar-2024 0520  NA  --  137 137 136 136 132* 129* 130* 133*  132*  K  --  2.6* 3.0* 3.0* 3.0* 3.8 4.2 4.2 5.3*  5.3*  CL  --  99 102 98 98 90* 84* 84* 83*  83*  CO2  --  9* 9* 12* 12* 17* 17* 17* 16*  16*  GLUCOSE  --  91 147* 139* 138* 118* 103* 99 67*  66*  BUN  --  11 12 11 11 11 11 11 10  10   CREATININE  --  1.05 1.16 1.18 1.17 1.16 1.32* 1.34* 1.27*  1.27*  CALCIUM   --  6.6* 6.1* 6.0* 6.1* 6.0* 6.6* 6.6* 5.9*  6.7*  MG 1.6* 2.2 2.0 2.0  --   --  2.4  --   --   PHOS 5.3*  --   --  3.6 3.6 3.5  --  4.3 6.0*   Liver Function Tests: Recent Labs  Lab 03/10/24 0425 03/10/24 1015 03/11/24 1346 03/12/24 0528 03/12/24 0806 03/12/24 1645 16-Mar-2024 0042 03/16/24 0520  AST 19 22 694*  --  3,465*  --   --   --   ALT 32 30 528*  --  2,750*  --   --   --   ALKPHOS 86 72 58  --  114  --   --   --   BILITOT 0.5 0.5 1.9*  --  2.8*  --   --   --   PROT 3.9* 4.0* 4.3*  --  3.4*  --   --   --   ALBUMIN  2.5* 2.5* 3.0* 2.4* 2.3* 2.4* 2.4* 2.2*   Recent Labs  Lab 03/12/24 0806  LIPASE 11   No  results for input(s): AMMONIA in the last 168 hours. CBC: Recent Labs  Lab 03/10/24 0435 03/10/24 1015 03/11/24 0457 03/12/24 0527 03/12/24 0805 03/12/24 1515 March 16, 2024 0041  WBC 0.4* 0.4* 0.1* 0.3*  --  0.6* 1.6*  NEUTROABS 0.1* 0.1*  --  0.2*  --   --  1.2*  HGB 8.6* 8.7* 8.7* 8.3*  --  8.1* 9.1*  HCT 26.9* 26.5* 25.7* 24.5*  --  23.7* 26.8*  MCV 98.2 95.3 93.1 94.6  --  92.6 93.7  PLT 110* 87* 50* 12* 12* 25* 20*   Cardiac Enzymes: No results for input(s): CKTOTAL, CKMB, CKMBINDEX, TROPONINI in the last 168 hours. Sepsis Labs: Recent Labs  Lab 03/10/24 1015 03/10/24 1555 03/11/24 0457 03/11/24 1109 03/12/24 0527 03/12/24 0804 03/12/24 1515 03/12/24 1645 03/12/24 2317 03/16/24 0041 03/16/2024 0455  PROCALCITON 27.70  --   --   --   --   --   --   --   --   --   --  WBC 0.4*  --  0.1*  --  0.3*  --  0.6*  --   --  1.6*  --   LATICACIDVEN  --    < > >9.0*   < > >9.0* >9.0*  --  >9.0* >9.0*  --  >9.0*   < > = values in this interval not displayed.    Procedures/Operations  CVL A-line CRRT   Dorn KATHEE Chill 03/14/2024, 4:02 PM   "

## 2024-03-17 NOTE — Plan of Care (Signed)
" °  Problem: Clinical Measurements: Goal: Ability to maintain clinical measurements within normal limits will improve Outcome: Progressing Goal: Diagnostic test results will improve Outcome: Progressing Goal: Respiratory complications will improve Outcome: Progressing Goal: Cardiovascular complication will be avoided Outcome: Progressing   Problem: Nutrition: Goal: Adequate nutrition will be maintained Outcome: Progressing   Problem: Elimination: Goal: Will not experience complications related to urinary retention Outcome: Progressing   Problem: Education: Goal: Knowledge of General Education information will improve Description: Including pain rating scale, medication(s)/side effects and non-pharmacologic comfort measures Outcome: Not Progressing   Problem: Clinical Measurements: Goal: Will remain free from infection Outcome: Not Progressing   "

## 2024-03-17 NOTE — Progress Notes (Signed)
 eLink Physician-Brief Progress Note Patient Name: Edwin Cook DOB: 1996-02-07 MRN: [id number]   Date of Service  22-Mar-2024  HPI/Events of Note  HR went down to the 110s earlier but is now back to 130s to 160s. Unable to doppler pulses both LE. Most recent ABG 7.36/29/74. Lactate persistently >9 despite CRRT. On Levo 26 and Vaso 0.04. Calcium  was given for a corrected calcium  of 7.9  eICU Interventions  Ordered bicarb push 50 meqs with HR improving to 110s to 120s still in afib Increase amiodarone  to 60 currently at 30. Bedside CCM team informed Spoke with family at bedside regarding code status and they would want patient to remain full code. They did inquire if the rest of the family should be called which I recommend that they do.     Intervention Category Intermediate Interventions: Arrhythmia - evaluation and management  Edwin Cook 03-22-2024, 5:04 AM

## 2024-03-17 NOTE — Progress Notes (Signed)
 Patient EKG revealed asystole, no pulse noted on exam, this nurse pushed 3 rounds of epinephrine  with no noted changes to heart rate. Informed charge nurse and Dr. Kara. Family gave verbal understanding of this patient's status and requested we stop treatment at this time.

## 2024-03-17 DEATH — deceased

## 2024-03-28 LAB — GLUCOSE, CAPILLARY: Glucose-Capillary: 120 mg/dL — ABNORMAL HIGH (ref 70–99)
